# Patient Record
Sex: Male | Born: 1945 | Race: White | Hispanic: No | Smoking: Never smoker
Health system: Southern US, Community
[De-identification: ages and names within clinical notes are randomized; demographics above are authoritative.]

## PROBLEM LIST (undated history)

## (undated) DIAGNOSIS — I2510 Atherosclerotic heart disease of native coronary artery without angina pectoris: Secondary | ICD-10-CM

## (undated) DIAGNOSIS — C61 Malignant neoplasm of prostate: Secondary | ICD-10-CM

## (undated) DIAGNOSIS — F329 Major depressive disorder, single episode, unspecified: Secondary | ICD-10-CM

## (undated) DIAGNOSIS — I309 Acute pericarditis, unspecified: Secondary | ICD-10-CM

## (undated) DIAGNOSIS — I10 Essential (primary) hypertension: Secondary | ICD-10-CM

## (undated) DIAGNOSIS — E119 Type 2 diabetes mellitus without complications: Secondary | ICD-10-CM

## (undated) DIAGNOSIS — G2581 Restless legs syndrome: Secondary | ICD-10-CM

## (undated) DIAGNOSIS — I82409 Acute embolism and thrombosis of unspecified deep veins of unspecified lower extremity: Secondary | ICD-10-CM

## (undated) DIAGNOSIS — E785 Hyperlipidemia, unspecified: Secondary | ICD-10-CM

## (undated) DIAGNOSIS — F0390 Unspecified dementia without behavioral disturbance: Secondary | ICD-10-CM

## (undated) DIAGNOSIS — L409 Psoriasis, unspecified: Secondary | ICD-10-CM

## (undated) DIAGNOSIS — F419 Anxiety disorder, unspecified: Secondary | ICD-10-CM

## (undated) DIAGNOSIS — R413 Other amnesia: Secondary | ICD-10-CM

## (undated) HISTORY — DX: Other amnesia: R41.3

## (undated) HISTORY — DX: Atherosclerotic heart disease of native coronary artery without angina pectoris: I25.10

## (undated) HISTORY — DX: Acute pericarditis, unspecified: I30.9

## (undated) HISTORY — DX: Hyperlipidemia, unspecified: E78.5

## (undated) HISTORY — DX: Restless legs syndrome: G25.81

## (undated) HISTORY — PX: KNEE SURGERY: SHX244

## (undated) HISTORY — PX: TONSILLECTOMY: SUR1361

## (undated) HISTORY — PX: HERNIA REPAIR: SHX51

## (undated) HISTORY — DX: Type 2 diabetes mellitus without complications: E11.9

## (undated) HISTORY — DX: Acute embolism and thrombosis of unspecified deep veins of unspecified lower extremity: I82.409

---

## 2001-06-02 ENCOUNTER — Ambulatory Visit (HOSPITAL_COMMUNITY): Admission: RE | Admit: 2001-06-02 | Discharge: 2001-06-02 | Payer: Self-pay | Admitting: Internal Medicine

## 2001-06-02 ENCOUNTER — Encounter: Payer: Self-pay | Admitting: Internal Medicine

## 2001-06-02 ENCOUNTER — Encounter (INDEPENDENT_AMBULATORY_CARE_PROVIDER_SITE_OTHER): Payer: Self-pay | Admitting: Specialist

## 2004-05-12 ENCOUNTER — Ambulatory Visit (HOSPITAL_COMMUNITY): Admission: RE | Admit: 2004-05-12 | Discharge: 2004-05-12 | Payer: Self-pay | Admitting: Orthopedic Surgery

## 2009-12-14 ENCOUNTER — Ambulatory Visit: Payer: Self-pay | Admitting: Diagnostic Radiology

## 2009-12-14 ENCOUNTER — Emergency Department (HOSPITAL_BASED_OUTPATIENT_CLINIC_OR_DEPARTMENT_OTHER): Admission: EM | Admit: 2009-12-14 | Discharge: 2009-12-14 | Payer: Self-pay | Admitting: Emergency Medicine

## 2009-12-22 ENCOUNTER — Ambulatory Visit: Payer: Self-pay | Admitting: Cardiology

## 2009-12-22 ENCOUNTER — Ambulatory Visit: Payer: Self-pay | Admitting: Pulmonary Disease

## 2009-12-22 ENCOUNTER — Telehealth (INDEPENDENT_AMBULATORY_CARE_PROVIDER_SITE_OTHER): Payer: Self-pay | Admitting: *Deleted

## 2009-12-22 DIAGNOSIS — L408 Other psoriasis: Secondary | ICD-10-CM

## 2009-12-22 DIAGNOSIS — R0602 Shortness of breath: Secondary | ICD-10-CM | POA: Insufficient documentation

## 2009-12-22 DIAGNOSIS — R930 Abnormal findings on diagnostic imaging of skull and head, not elsewhere classified: Secondary | ICD-10-CM | POA: Insufficient documentation

## 2009-12-30 ENCOUNTER — Telehealth (INDEPENDENT_AMBULATORY_CARE_PROVIDER_SITE_OTHER): Payer: Self-pay | Admitting: *Deleted

## 2010-01-26 HISTORY — PX: NM MYOCAR PERF WALL MOTION: HXRAD629

## 2010-01-28 HISTORY — PX: US ECHOCARDIOGRAPHY: HXRAD669

## 2010-05-26 NOTE — Progress Notes (Signed)
Summary: talk to nurse  Phone Note Call from Patient Call back at [phone]   Caller: Daughter jody Call For: clance Summary of Call: daughter would like to talk to nurse about pt lung test and what dr clance plan to do next. Initial call taken by: Rickard Patience,  December 22, 2009 3:22 PM  Follow-up for Phone Call        Spoke with pt's daughter Augusto Gamble.  She is requesting results of CT chest- states that she was told by radiologist that there was not any evidence of pulmonary emboli, so now is wants to know what the next step is.  Pls advise thanks! Follow-up by: Vernie Murders,  December 22, 2009 3:27 PM  Additional Follow-up for Phone Call Additional follow up Details #1::        pt's daughter called again, daughter very concerned that if he starts with breathing distress tonight, needs doc on call to be able to say take him to ED and then be there to admit him.  Wants his pulmonary team to be there,  Going on over 2 weeks now. Additional Follow-up by: Eugene Gavia,  December 22, 2009 5:09 PM    Additional Follow-up for Phone Call Additional follow up Details #2::    please let her know that his ct showed no blood clots, and nothing else to explain his symptoms.  He has very little abnormality in the bottom of both lungs that may be due to resolving pna or lung that needs to pop back open after his episode of pna.  As we discussed, I think he needs to see a cardiologist for further evaluation.Marland KitchenMarland KitchenI will send a note to his primary md to make the referral.  If he has worsening symptoms, he should go to er for evaluation by ER md. Follow-up by: Barbaraann Share MD,  December 22, 2009 5:23 PM  Additional Follow-up for Phone Call Additional follow up Details #3:: Details for Additional Follow-up Action Taken: Spoke with pt and notified of results/recs per [facility].  Daughter verbalized understanding. Additional Follow-up by: Vernie Murders,  December 22, 2009 5:28 PM   Appended Document: talk to  nurse megan, please call this pt this am and make sure they call his primary md to get set up for a cardiac evaluation.  thanks  Appended Document: talk to nurse called and spoke with pt's daughter, Augusto Gamble, and reminded her to call pt's pcp today to get set up for a cardiac eval.  daughter states pt's symptoms haven't improved and she is going to take him to [facility] ER today to be evaluated.  Will forward message to [facility] as an FYI.   Appended Document: talk to nurse noted.

## 2010-05-26 NOTE — Progress Notes (Signed)
Summary: ? cards workup vs pulm  Phone Note Call from Patient   Caller: Daughter Call For: clance Summary of Call: pt was taken to [facility] er recently. was told that his SOB is due to pulm issues, not cardio related which daughter says is the opposite of what she understood dr clance to say. daughter Bridgette Habermann requests to speak to kc. pt continues to be SOB and has fever at night. jody # [phone] Initial call taken by: Tivis Ringer, CNA,  December 30, 2009 11:24 AM  Follow-up for Phone Call        Spoke with pt's daughter Augusto Gamble.  She states that pt was just seen at [facility] ER for fever, SOB, and weakness.  She states that they did a cxr and told her that pt has thickening in the lining of lungs that is chronic, and needed to see pulmonary doc.  She states that she told them that pt already had neg pulm workup and was told by [facility] needed to see cards.  They advised that this was wrong and nothing cardiac was going on.  She states that pt continues to have SOB, fever at night and weakness.  She states that he is no better since last seen.  Wants [facility] to review his ct scans again. Pls advise thanks Follow-up by: Vernie Murders,  December 30, 2009 2:05 PM  Additional Follow-up for Phone Call Additional follow up Details #1::        We have a ct scan from the end of august that does NOT show thickening of the lining of the lung.  A ct scan is 10 times better than a cxr at showing these things.  There is nothing on that ct scan which should cause neck pain, fever, or shortness of breath.  He does have slight thickening of the lining around the HEART, and has calcifications in the arteries that feed blood to the heart.  I am STRONGLY recommending that he see his primary care doctor ASAP for evaluation, and not keep going thru ER unless he is having an emergency Additional Follow-up by: Barbaraann Share MD,  December 30, 2009 4:45 PM    Additional Follow-up for Phone Call Additional follow up Details #2::    Spoke with pt's daughter Augusto Gamble and notified of the above recs per [facility].  Jody verbalized understanding and states that pt is to see his PCP tommorrow. Follow-up by: Vernie Murders,  December 30, 2009 4:55 PM

## 2010-05-26 NOTE — Assessment & Plan Note (Signed)
Summary: self referral for dyspnea, atypical chest pain   Copy to:  Self Referral Primary Yina Riviere/Referring Dereck Agerton:  Edgardo Roys, PA from [facility] in [facility]   History of Present Illness: The pt is a 65y/o male who comes in today as a self referral for evaluation of dyspnea, atypical chest pain, and ?recent pna.  Approximately 2 weeks ago, he noted the onset of atypical pain in his upper chest that radiated into his neck, and was associated with worsening sob.  This came on fairly suddenly, and was not pleuritic in nature.  He had no cough, congestion, or mucus production.  He did feel chills, and had a temp of 100 degrees.  He went to ER in [place], where a cxr showed poor inspiratory effort with small lung volumes, and basilar atx changes vs pna.  He was admitted to [facility], and given IV abx with oral abx at discharge of zpack and omnicef.  The pt felt some better once he got home, but did not return to his usual baseline.  He continues to feel "sick" and very weak.  Last night, he began to feel the "soreness" in his neck again, along with some intermittant discomfort in his chest and increasing sob.  He continues to deny chest congestion, cough, or mucus.  He is denying pleuritic chest pain.  He denies classic anginal chest pain, and has no history of cardiac disease.  Preventive Screening-Counseling & Management  Alcohol-Tobacco     Smoking Status: never  Current Medications (verified): 1)  Celexa 40 Mg Tabs (Citalopram Hydrobromide) .... Take 2 Tabs By Mouth At Bedtime 2)  Folcolin 40mg  .... Take 1 Tablet By Mouth Once A Day 3)  Xanax 1 Mg Tabs (Alprazolam) .... Take 1 Tablet By Mouth Three Times A Day 4)  Ritalin 10 Mg Tabs (Methylphenidate Hcl) .... As Needed 5)  Enbrel .... Take As Directed  Allergies (verified): No Known Drug Allergies  Past History:  Past Medical History:  PSORIASIS (ICD-696.1)    Past Surgical History: hernia surgery x 4 R knee surery tonsillectomy     Family History: Reviewed history and no changes required. none per pt  Social History: Reviewed history and no changes required. Patient never smoked.  pt is widowed. pt has children. pt lives alone. pt is retired.  prev worked for the [employer] in [employer] Status:  never  Review of Systems       The patient complains of shortness of breath with activity, shortness of breath at rest, chest pain, loss of appetite, sore throat, and fever.  The patient denies productive cough, non-productive cough, coughing up blood, irregular heartbeats, acid heartburn, indigestion, weight change, abdominal pain, difficulty swallowing, tooth/dental problems, headaches, nasal congestion/difficulty breathing through nose, sneezing, itching, ear ache, anxiety, depression, hand/feet swelling, joint stiffness or pain, rash, and change in color of mucus.    Vital Signs:  Patient profile:   65 year old male Height:      70.5 inches Weight:      203.25 pounds BMI:     28.86 O2 Sat:      96 % on Room air Temp:     98.4 degrees F oral Pulse rate:   102 / minute BP sitting:   140 / 70  (left arm) Cuff size:   regular  Vitals Entered By: Arman Filter LPN (December 22, 2009 11:28 AM)  O2 Flow:  Room air Comments Medications reviewed with patient Arman Filter LPN  December 22, 2009 11:32  AM    Physical Exam  General:  disheveled male in nad Eyes:  PERRLA and EOMI.   Nose:  patent without discharge Mouth:  clear Neck:  no jvd, tmg, LN no pain on palpation, no masses noted. Lungs:  mild basilar crackles, poor depth of inspiration no wheeziing or rhonchi Heart:  rrr, rate 96, no mrg Abdomen:  benign Extremities:  no cyanosis noted pt had boots difficult to remove....denies LE edema Neurologic:  alert and oriented, moves all 4.   Impression & Recommendations:  Problem # 1:  ABNORMAL CHEST XRAY (ICD-793.1) the pt has an abnormal cxr with basilar atx vs airspace disease, but has a very poor  inspiratory effort.  He is having various symptoms which are difficult for him to clarify, including this atypical upper chest and neck pain.  It really is unclear if he ever had pna, or whether this was simply atx related to poor depth of inpiration from splinting.  At this point, since he is still having symptoms despite abx, will check a ct chest.  This will help characterize his basilar abnl on cxr, will r/o PE, and will also evaluate for other vascular abnormalites of aorta, great vessels of chest.  If nothing is found of significance, I have recommended to him that he see his primary MD at the [facility] and arrange for cardiac evaluation.    Medications Added to Medication List This Visit: 1)  Celexa 40 Mg Tabs (Citalopram hydrobromide) .... Take 2 tabs by mouth at bedtime 2)  Folcolin 40mg   .... Take 1 tablet by mouth once a day 3)  Xanax 1 Mg Tabs (Alprazolam) .... Take 1 tablet by mouth three times a day 4)  Ritalin 10 Mg Tabs (Methylphenidate hcl) .... As needed 5)  Enbrel  .... Take as directed  Other Orders: New Patient Level V (91478) Radiology Referral (Radiology)  Patient Instructions: 1)  will check ct scan of your chest to evaluate for lung and vascular abnormalities. 2)  will call you with results when available 3)  if worsening symptoms that you feel need to be addressed in the interim, please go to the ER.

## 2010-07-02 ENCOUNTER — Observation Stay (HOSPITAL_COMMUNITY)
Admission: RE | Admit: 2010-07-02 | Discharge: 2010-07-02 | Disposition: A | Discharge status: Home / Self Care | Payer: Medicare Other | Source: Ambulatory Visit | Attending: Cardiology | Admitting: Cardiology

## 2010-07-02 DIAGNOSIS — R079 Chest pain, unspecified: Principal | ICD-10-CM | POA: Insufficient documentation

## 2010-07-02 DIAGNOSIS — I2510 Atherosclerotic heart disease of native coronary artery without angina pectoris: Secondary | ICD-10-CM | POA: Insufficient documentation

## 2010-07-02 DIAGNOSIS — R42 Dizziness and giddiness: Secondary | ICD-10-CM | POA: Insufficient documentation

## 2010-07-02 DIAGNOSIS — R0602 Shortness of breath: Secondary | ICD-10-CM | POA: Insufficient documentation

## 2010-07-02 HISTORY — PX: CARDIAC CATHETERIZATION: SHX172

## 2010-07-02 LAB — URINALYSIS, ROUTINE W REFLEX MICROSCOPIC
Bilirubin Urine: NEGATIVE
Ketones, ur: NEGATIVE mg/dL
Leukocytes, UA: NEGATIVE
Nitrite: NEGATIVE
Protein, ur: 100 mg/dL — AB

## 2010-07-02 LAB — APTT: aPTT: 26 seconds (ref 24–37)

## 2010-07-02 LAB — URINE MICROSCOPIC-ADD ON

## 2010-07-02 LAB — PROTIME-INR
INR: 1.03 (ref 0.00–1.49)
Prothrombin Time: 13.7 seconds (ref 11.6–15.2)

## 2010-07-08 NOTE — H&P (Signed)
NAME:  Gary Underwood, Gary Underwood NO.:  [id number]  MEDICAL RECORD NO.:  [id number]          PATIENT TYPE:  LOCATION:                                 FACILITY:  PHYSICIAN:  Thurmon Fair, MD     DATE OF BIRTH:  06-Nov-1945  DATE OF ADMISSION: DATE OF DISCHARGE:                             HISTORY & PHYSICAL   This dictation is in anticipation of cardiac catheterization to be performed tomorrow, March 8.  CHIEF COMPLAINT:  Chest pain.  REASON FOR CARDIAC CATHETERIZATION:  Possible unstable angina.  Gary Underwood is a 65 year old gentleman with known coronary artery calcifications by chest CT, but with a normal nuclear stress test (Persantine Myoview performed on November 26, 2009).  He presents after experiencing a 1-hour episode of severe anterior chest tightness radiating to his lower teeth last night.  The symptoms woke him from sleep.  The chest discomfort was severe and was associated with some moderate shortness of breath, but no diaphoresis, nausea, or vomiting. The symptoms gradually resolved spontaneously and he only mentioned them to his daughter this morning.  He believes that he might have had a heart attack.  The electrocardiogram performed in the office on arrival does not show any changes from his chronic EKG.  It shows sinus rhythm with a suggestion of a minor intraventricular conduction delay.  It has not significantly changed from previous tracings.  He is currently completely asymptomatic, although he feels a little "shaken up" emotionally.  He feels little dizzy and lightheaded, but has not had any syncope.  He has complained of dizziness before in the past.  He has a lot of complaints of arthralgias and muscle aches, which are chronic problems. Has not had any focal neurological deficits, intermittent claudication, lower extremity edema, fever, chills, cough, hemoptysis, pleurisy, or other recent changes on his review of systems.  PAST MEDICAL  HISTORY:  Significant for psoriasis for which he takes Enbrel infusions on a regular basis.  He is actually due for his next Enbrel infusion tomorrow.  About 6 months ago, he presented with marked shortness of breath and had a moderately large pericardial effusion.  Serology suggested that he might have had a case of acute Epstein-Barr virus myopericarditis.  His symptoms resolved after treatment nonsteroidal anti-inflammatory drugs. His Enbrel was briefly interrupted, but was restarted after the resolution of the pericarditis, which was demonstrated with serial echocardiograms.  During the workup, he underwent a CT of the chest that showed moderate LAD coronary artery calcifications, which was the reason he underwent his stress test.  He does have psoriatic arthritis.  PAST MEDICAL HISTORY:  Significantly negative for hypertension and diabetes mellitus, but he has a fairly severe hyperlipidemia and therefore we started on cholesterol-lowering therapy in October of last year.  A lipid profile was just performed earlier this week and now shows very favorable findings with a total cholesterol of 139, triglycerides of 85, HDL of 43, and LDL of 79.  On the same set of laboratory tests, he had an essentially normal routine chemistry panel with the exception of a borderline fasting glucose of 119.  The creatinine  was 1.1.  His CBC was likewise normal except for borderline polycythemia with a hemoglobin of 17.1, but the white blood cell count and platelet count were normal.  His TSH is normal.  His hemoglobin A1c was 6%.  A chest x-ray that was performed on March 1 was also normal. Previously described atelectasis in the lower lungs was no longer seen.  CURRENT MEDICATIONS: 1. Celexa 80 mg daily. 2. Focalin XR 30 mg daily. 3. 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 4. Xanax 1 mg t.i.d. 5. Enbrel injections weekly. 6. Lipitor 80 mg daily. 7. Abilify 10 mg daily. 8. Lisinopril 10 mg  daily. 9. Fish oil once daily.  He has known drug allergies.  PAST SURGICAL HISTORY:  Significant for knee surgery.  SOCIAL HISTORY:  He is a widower and is closest family is his daughter, Gary Underwood.  He does not drink, use recreational drugs, or smoke cigarettes. He usually walks on a daily basis.  PHYSICAL EXAMINATION:  GENERAL:  He looks worried, but is in no acute distress. VITAL SIGNS:  His blood pressure is 122/84, heart rate is 95, respiratory rate is 16, he weighs 216 pounds, which is actually an 18- pound increase since his last visit here in October.  He is 5 feet 10 inches tall. HEENT:  The head exam is unremarkable except for some patches of psoriasis around his scalp.  Pupils equal, round, and reactive to light. Extraocular movements intact.  Mouth, ears, nose, and oropharynx are within normal limits. NECK:  Supple.  Jugular venous pulsations are normal.  The are no carotid bruits. LUNGS:  Clear to auscultation bilaterally with symmetric respiratory excursions.  There is no evidence of abnormal fremitus, dullness to percussion, egophony, or other signs of consolidation. CARDIOVASCULAR:  Shows a  normal position and quality of apical impulse. Normal first and second heart sounds without any murmurs, rubs or gallops. ABDOMEN:  Soft, nontender, nondistended without any palpable mass, abnormal pulsatility, or flank bruits.  The bowel sounds are normal. EXTREMITIES:  Do not show any clubbing, cyanosis, or edema.  He has 2+ pulses in the radials, ulnars, brachials, femorals, popliteals, dorsalis pedis, and posterior tibials bilaterally.  He has some reduced range of motion in the right shoulder, presumably due to a rotator cuff tear.  His electrocardiogram and labs are described above as is his chest x- ray.  ASSESSMENT/PLAN:  Possible unstable angina.  Gary Underwood has known coronary atherosclerosis by CT scanning of the chest, but a fairly recent nuclear scan did not show  any evidence of coronary insufficiency. Nevertheless, his symptoms could be due to unstable angina.  He believed that the discomfort is chest is very different from any problems with reflux he may have had in the past.  It should also be noted that his current chest pain syndrome was in no way similar to his presentation when he had myopericarditis.  At that time, he had prominent shortness of breath and really no chest pain.  We discussed various options for further evaluation and I have recommended that he undergo coronary angiography to establish a definitive diagnosis.  He is scheduled to have this procedure done tomorrow.  He has been given 325 mg of aspirin, he is told to take a similar dose tomorrow morning.  If he should have any recurrence of chest pain lasting more than 15 minutes, he should immediately go to the emergency room.  The risks and benefits of diagnostic cardiac catheterization and possible percutaneous revascularization were discussed in  detail with Gary Underwood and his daughter. They understand and agree to proceed.     Thurmon Fair, MD     MC/MEDQ  D:  07/01/2010  T:  07/02/2010  Job:  [id number]  cc:   [facility] Georgianne Fick, M.D.  Electronically Signed by Thurmon Fair M.D. on 07/08/2010 12:40:11 PM

## 2010-07-08 NOTE — Procedures (Signed)
Gary Underwood, [name] NO.:  [id number]  MEDICAL RECORD NO.:  [id number]           PATIENT TYPE:  I  LOCATION:  2029                         FACILITY:  [facility]  PHYSICIAN:  Landry Corporal, MD DATE OF BIRTH:  1945/11/08  DATE OF PROCEDURE:  07/02/2010 DATE OF DISCHARGE:  07/02/2010                           CARDIAC CATHETERIZATION   PRIMARY CARDIOLOGIST:  Thurmon Fair, MD  PRIMARY CARE PHYSICIAN:  Georgianne Fick, MD  PROCEDURE PERFORMED: 1. Left heart catheterization via 5-French right femoral artery     access. 2. Left ventriculography from right RAO projection with 12 mL/second     of contrast for 25 seconds. 3. Selective native coronary angiography.  INDICATIONS:  Chest pain concerning for unstable angina.  BRIEF HISTORY:  Gary Underwood is a 65 year old gentleman with a history of hypertension, hyperlipidemia, who has had evaluation for chest pain in the past with negative Myoview.  However, several days ago, he had an episode of resting chest pain lasting roughly 1 hour.  He was then seen by Dr. Royann Shivers in the office, [facility].  After long discussion, decision was made to proceed with diagnostic cardiac catheterization for possible unstable angina.  The risks, benefits, alternatives, and indication of the procedure were explained to the patient in detail.  The patient voiced understanding and all questions answered.  The patient agreed to proceed and informed consent was obtained with the signed form placed on the chart.  PROCEDURE:  The patient was brought to the second floor [place] cardiac catheterization lab in the fasting state.  He was prepped and draped in usual sterile fashion with a right femoral access site prepped.  A time-out period was performed, and the patient was then sedated with intravenous Versed and fentanyl.  The right femoral head was then localized using tactile fluoroscopic  guidance, and the right groin was then anesthetized using 1% subcutaneous lidocaine.  The right common femoral artery was then accessed using the modified Seldinger technique with placement of a 5-French sheath.  Sheath was aspirated and flushed, and first a 5-French JL-4 followed by JR-4 catheter was advanced over wire.  Multiple angiographic views of first the right and left coronary systems were obtained.  The JR-4 catheter was then exchanged over the wire for a 5-French angled pigtail catheter, which was advanced across the aortic valve into the left ventricle.  Left ventricle hemodynamics were measured, and the left ventriculogram was performed in the RAO projection.  After completion of the left ventriculogram, the catheter was aspirated, flushed, and the left ventricle hemodynamics were remeasured.  Catheter was then pulled back across the aortic valve measuring the pullback gradient across the aortic valve.  CATHETERIZATION LABORATORY STATISTICS: 1. Sedation:  1 mg of Versed and 25 mcg of IV fentanyl. 2. Contrast:  80 mL of contrast.  COMPLICATIONS.:  None.  ESTIMATED BLOOD LOSS:  Less than 10 mL.  The patient was stable before, during, and after the procedure.  FINDINGS:  HEMODYNAMICS: 1. Aortic pressure 117/80 mmHg with a mean of 98 mmHg.     a.     Left  ventricular pressure 108/9 mmHg with an EDP of 40 mmHg. 2. Left ventriculogram showed ejection fraction 65% ,no wall motion     abnormalities.  ANGIOGRAPHIC FINDINGS: 1. The right coronary artery is a small nondominant artery with maybe     caliber dominant with partial posterior descending artery coming     from the a long large marginal branch.  The artery itself is a     small and mostly nondominant. 2. The left main is a large-caliber vessel, which bifurcates into the     LAD and circumflex. 3. The LAD is a moderate-to-large caliber vessel, which is diffusely     calcified from the left main down to about just  to the takeoff of     the first septal perforator.  Just following this, there is a     calcification that stops, and there is a step-down from a large-     caliber vessel and a moderate caliber vessel, but remainder of the     vessel itself remains the same caliber making this unlikely to be a     lesion, best 20-30% decreased in diameter.  There are two diagonal     branches with no significant disease.  Just after the first     diagonal branch, there is 20-30% lesion and then following the     second branch, has a short area, which was likely a myocardial     bridge.  The reminder of the LAD has no significant disease. 4. Circumflex artery is a large dominant vessel that gives rise to a     small first obtuse marginal and then a large for 4.5 mm second     obtuse marginal, which has up to 40% ostial lesion.  Remainder of     the circumflex close to the AV groove gives rise to posterior     descending artery and posterolateral branches.  No significant     disease is rest of this vessel.  IMPRESSION: 1. Mild-to-moderate nonobstructive coronary artery disease with     calcific left main proximal LAD and proximal circumflex consistent     with a CT scan. 2. Left dominant system.  No evidence of any coronary artery disease     to explain the chest pain. 3. Normal to hyperdynamic left ventricular function with no wall     motion abnormalities. 4. Small roughly 10-mm aortic valve pullback gradient.  PLAN:  The patient will be transferred to the holding area for sheath removal and adequate hemostasis, likely will be discharged tonight to follow Dr. Royann Shivers for continued medical management.          ______________________________ Landry Corporal, MD     DWH/MEDQ  D:  07/02/2010  T:  07/03/2010  Job:  [id number]  cc:   Georgianne Fick, M.D. Thurmon Fair, MD  Electronically Signed by Bryan Lemma MD on 07/08/2010 11:59:43 AM

## 2010-07-10 LAB — BASIC METABOLIC PANEL
BUN: 12 mg/dL (ref 6–23)
Chloride: 103 mEq/L (ref 96–112)
GFR calc non Af Amer: >60 mL/min (ref >60)
Glucose, Bld: 90 mg/dL (ref 70–99)
Potassium: 4 mEq/L (ref 3.5–5.1)
Sodium: 139 mEq/L (ref 135–145)

## 2010-07-10 LAB — POCT CARDIAC MARKERS
CKMB, poc: 1.5 ng/mL (ref 1.0–8.0)
Troponin i, poc: <0.05 ng/mL (ref 0.00–0.09)

## 2010-07-10 LAB — CBC
HCT: 47.6 % (ref 39.0–52.0)
Hemoglobin: 16.1 g/dL (ref 13.0–17.0)
MCV: 89.9 fL (ref 78.0–100.0)
RDW: 11.6 % (ref 11.5–15.5)
WBC: 13 10*3/uL — ABNORMAL HIGH (ref 4.0–10.5)

## 2010-07-10 LAB — DIFFERENTIAL
Basophils Absolute: 0.4 10*3/uL — ABNORMAL HIGH (ref 0.0–0.1)
Eosinophils Relative: 1 % (ref 0–5)
Lymphocytes Relative: 16 % (ref 12–46)
Monocytes Absolute: 2 10*3/uL — ABNORMAL HIGH (ref 0.1–1.0)
Monocytes Relative: 16 % — ABNORMAL HIGH (ref 3–12)
Neutro Abs: 8.4 10*3/uL — ABNORMAL HIGH (ref 1.7–7.7)

## 2010-07-12 ENCOUNTER — Emergency Department (HOSPITAL_COMMUNITY)
Admission: EM | Admit: 2010-07-12 | Discharge: 2010-07-12 | Disposition: A | Discharge status: Home / Self Care | Payer: Medicare Other | Attending: Emergency Medicine | Admitting: Emergency Medicine

## 2010-07-12 DIAGNOSIS — E785 Hyperlipidemia, unspecified: Secondary | ICD-10-CM | POA: Insufficient documentation

## 2010-07-12 DIAGNOSIS — M4800 Spinal stenosis, site unspecified: Secondary | ICD-10-CM | POA: Insufficient documentation

## 2010-07-12 DIAGNOSIS — R29898 Other symptoms and signs involving the musculoskeletal system: Secondary | ICD-10-CM | POA: Insufficient documentation

## 2010-07-12 DIAGNOSIS — I10 Essential (primary) hypertension: Secondary | ICD-10-CM | POA: Insufficient documentation

## 2010-07-12 LAB — DIFFERENTIAL
Basophils Absolute: 0.1 10*3/uL (ref 0.0–0.1)
Eosinophils Relative: 3 % (ref 0–5)
Lymphocytes Relative: 21 % (ref 12–46)
Lymphs Abs: 2.6 10*3/uL (ref 0.7–4.0)
Neutro Abs: 8.2 10*3/uL — ABNORMAL HIGH (ref 1.7–7.7)
Neutrophils Relative %: 66 % (ref 43–77)

## 2010-07-12 LAB — CBC
HCT: 48.1 % (ref 39.0–52.0)
Hemoglobin: 16.8 g/dL (ref 13.0–17.0)
MCV: 88.6 fL (ref 78.0–100.0)
RBC: 5.43 MIL/uL (ref 4.22–5.81)
RDW: 12.7 % (ref 11.5–15.5)
WBC: 12.5 10*3/uL — ABNORMAL HIGH (ref 4.0–10.5)

## 2010-07-12 LAB — BASIC METABOLIC PANEL
BUN: 13 mg/dL (ref 6–23)
Chloride: 100 mEq/L (ref 96–112)
GFR calc non Af Amer: >60 mL/min (ref >60)
Glucose, Bld: 96 mg/dL (ref 70–99)
Potassium: 4.1 mEq/L (ref 3.5–5.1)
Sodium: 136 mEq/L (ref 135–145)

## 2010-07-19 NOTE — Discharge Summary (Addendum)
Gary Underwood, Gary Underwood NO.:  [id number]  MEDICAL RECORD NO.:  [id number]           PATIENT TYPE:  E  LOCATION:  [facility]                         FACILITY:  [facility]  PHYSICIAN:  Landry Corporal, MD DATE OF BIRTH:  03/30/1946  DATE OF ADMISSION:  07/02/2010 DATE OF DISCHARGE:  07/02/2010                              DISCHARGE SUMMARY   ADMITTING CARDIOLOGIST:  Thurmon Fair, MD  PRIMARY PHYSICIAN:  Georgianne Fick, MD  ADMITTING DIAGNOSIS:  Chest pain concerning for unstable angina.  DISCHARGE DIAGNOSES:  Likely nonanginal chest pain with normal coronary arteries, but with nonobstructive coronary artery disease by cardiac catheterization.  BRIEF HISTORY:  Mr. Levario is a 65 year old gentleman with history of significant coronary artery calcification by chest CT with a normal Persantine Myoview on November 26, 2009, who also has a history of psoriasis taking Enbrel who noticed 1 hour episode of severe anterior chest tightness radiating to his lower teeth on the evening of the June 30, 2010.  He presented to Dr. Royann Shivers on 7th and the decision was made to proceed with diagnostic cardiac catheterization, plus/minus PCI.  He was then referred for outpatient cardiac catheterization  to be performed by myself.  BRIEF HOSPITAL COURSE:  The patient was admitted overnight and was brought to this second floor [facility] on the July 02, 2010.  He underwent diagnostic cardiac catheterization of which the full reports are dictated elsewhere.  I believe there was only 40% lesion in the large obtuse marginal branch with otherwise mild luminal irregularities in codominant left and right system.  EF was 65% with no wall motion abnormalities.  There was diffuse calcifications noted in the left main as well as proximal LAD and circumflex vessels.  The procedure was performed via the right femoral artery and the sheath was removed in the  holding area, and the patient was transferred to the postprocedure unit in a stable condition. He has no significant obstructive coronary disease noted, once the postprocedure monitoring was complete, the patient was discharged in stable condition to follow up Dr. Royann Shivers and Dr. Nicholos Johns.  ADMISSION MEDICATIONS: 1. Etanercept 50 mg/mL subcu 1 mL weekly. 2. Acidophilus 1 tablet daily. 3. Fish oil 1 dose daily. 4. Aspirin 81 mg daily. 5. Lisinopril 10 mg daily. 6. Alprazolam 0.5 mg b.i.d. as needed. 7. Citalopram 40 mg daily. 8. Atorvastatin 80 mg daily. 9. Abilify 10 mg at bedtime. 10.Focalin which is dexmethylphenidate 30 mg p.o. q.a.m.  DISCHARGE MEDICATIONS:  Some medications were continued.  PROCEDURE PERFORMED:  Left heart catheterization as described above.  DISCHARGE DISPOSITION:  The patient was stable at that time of discharge.  His groin was stable, and he had no further episodes of chest pain since the evening at 6.  He was discharged in stable condition to follow up with Dr. Royann Shivers and Dr. Nicholos Johns as he willbe scheduled by the [facility] staff.          ______________________________ Landry Corporal, MD     DWH/MEDQ  D:  07/13/2010  T:  07/14/2010  Job:  [id number]  cc:  Thurmon Fair, MD Georgianne Fick, M.D.  Electronically Signed by Bryan Lemma MD on 07/22/2010 11:19:35 PM

## 2012-04-09 ENCOUNTER — Emergency Department (HOSPITAL_BASED_OUTPATIENT_CLINIC_OR_DEPARTMENT_OTHER): Payer: Medicare Other

## 2012-04-09 ENCOUNTER — Emergency Department (HOSPITAL_BASED_OUTPATIENT_CLINIC_OR_DEPARTMENT_OTHER)
Admission: EM | Admit: 2012-04-09 | Discharge: 2012-04-09 | Disposition: A | Discharge status: Home / Self Care | Payer: Medicare Other | Attending: Emergency Medicine | Admitting: Emergency Medicine

## 2012-04-09 ENCOUNTER — Encounter (HOSPITAL_BASED_OUTPATIENT_CLINIC_OR_DEPARTMENT_OTHER): Payer: Self-pay | Admitting: *Deleted

## 2012-04-09 DIAGNOSIS — I10 Essential (primary) hypertension: Secondary | ICD-10-CM | POA: Insufficient documentation

## 2012-04-09 DIAGNOSIS — R21 Rash and other nonspecific skin eruption: Secondary | ICD-10-CM | POA: Insufficient documentation

## 2012-04-09 DIAGNOSIS — Z79899 Other long term (current) drug therapy: Secondary | ICD-10-CM | POA: Insufficient documentation

## 2012-04-09 DIAGNOSIS — L408 Other psoriasis: Secondary | ICD-10-CM | POA: Insufficient documentation

## 2012-04-09 DIAGNOSIS — L409 Psoriasis, unspecified: Secondary | ICD-10-CM

## 2012-04-09 DIAGNOSIS — I82409 Acute embolism and thrombosis of unspecified deep veins of unspecified lower extremity: Secondary | ICD-10-CM | POA: Insufficient documentation

## 2012-04-09 HISTORY — DX: Essential (primary) hypertension: I10

## 2012-04-09 HISTORY — DX: Psoriasis, unspecified: L40.9

## 2012-04-09 LAB — CBC WITH DIFFERENTIAL/PLATELET
Eosinophils Absolute: 0.9 10*3/uL — ABNORMAL HIGH (ref 0.0–0.7)
HCT: 45.1 % (ref 39.0–52.0)
Hemoglobin: 15.3 g/dL (ref 13.0–17.0)
Lymphs Abs: 2.3 10*3/uL (ref 0.7–4.0)
MCH: 27.9 pg (ref 26.0–34.0)
Monocytes Absolute: 1.5 10*3/uL — ABNORMAL HIGH (ref 0.1–1.0)
Monocytes Relative: 14 % — ABNORMAL HIGH (ref 3–12)
Neutro Abs: 5.7 10*3/uL (ref 1.7–7.7)
Neutrophils Relative %: 55 % (ref 43–77)
RBC: 5.49 MIL/uL (ref 4.22–5.81)

## 2012-04-09 LAB — COMPREHENSIVE METABOLIC PANEL
Alkaline Phosphatase: 100 U/L (ref 39–117)
BUN: 13 mg/dL (ref 6–23)
Chloride: 105 mEq/L (ref 96–112)
Creatinine, Ser: 1.2 mg/dL (ref 0.50–1.35)
GFR calc Af Amer: 71 mL/min — ABNORMAL LOW (ref >90)
Glucose, Bld: 98 mg/dL (ref 70–99)
Potassium: 4 mEq/L (ref 3.5–5.1)
Total Bilirubin: 0.5 mg/dL (ref 0.3–1.2)

## 2012-04-09 LAB — PRO B NATRIURETIC PEPTIDE: Pro B Natriuretic peptide (BNP): 39.3 pg/mL (ref 0–125)

## 2012-04-09 MED ORDER — ENOXAPARIN SODIUM 100 MG/ML ~~LOC~~ SOLN: 1.0000 mg/kg | Freq: Two times a day (BID) | SUBCUTANEOUS | Status: DC

## 2012-04-09 MED ORDER — WARFARIN SODIUM 5 MG PO TABS: 5.0000 mg | ORAL_TABLET | Freq: Every day | ORAL | Status: DC

## 2012-04-09 MED ORDER — CLINDAMYCIN HCL 150 MG PO CAPS: 150.0000 mg | ORAL_CAPSULE | Freq: Four times a day (QID) | ORAL | Status: DC

## 2012-04-09 MED ORDER — DIPHENHYDRAMINE HCL 50 MG/ML IJ SOLN
25.0000 mg | Freq: Once | INTRAMUSCULAR | Status: AC
  Administered 2012-04-09: 25 mg via INTRAVENOUS
  Filled 2012-04-09: qty 1

## 2012-04-09 MED ORDER — SODIUM CHLORIDE 0.9 % IV SOLN
Freq: Once | INTRAVENOUS | Status: AC
  Administered 2012-04-09: 14:00:00 via INTRAVENOUS

## 2012-04-09 MED ORDER — ENOXAPARIN SODIUM 100 MG/ML ~~LOC~~ SOLN
1.0000 mg/kg | Freq: Once | SUBCUTANEOUS | Status: AC
  Administered 2012-04-09: 95 mg via SUBCUTANEOUS
  Filled 2012-04-09: qty 1

## 2012-04-09 MED ORDER — WARFARIN SODIUM 5 MG PO TABS
5.0000 mg | ORAL_TABLET | Freq: Once | ORAL | Status: AC
  Administered 2012-04-09: 5 mg via ORAL
  Filled 2012-04-09: qty 1

## 2012-04-09 MED ORDER — CLINDAMYCIN PHOSPHATE 600 MG/50ML IV SOLN
600.0000 mg | Freq: Once | INTRAVENOUS | Status: AC
  Administered 2012-04-09: 600 mg via INTRAVENOUS
  Filled 2012-04-09: qty 50

## 2012-04-09 MED ORDER — IOHEXOL 350 MG/ML SOLN
100.0000 mL | Freq: Once | INTRAVENOUS | Status: AC | PRN
  Administered 2012-04-09: 100 mL via INTRAVENOUS

## 2012-04-09 NOTE — ED Provider Notes (Signed)
Patient turned over to me from Dr. Consuella Lose. Patient with known DVT based on Doppler studies done earlier today. CT angiogram rule out the ulnar embolism was negative. Discharge instructions and followup as an outpatient and treatment with Lovenox has already been arranged.  Results for orders placed during the hospital encounter of 04/09/12  CBC WITH DIFFERENTIAL      Component Value Range   WBC 10.4  4.0 - 10.5 K/uL   RBC 5.49  4.22 - 5.81 MIL/uL   Hemoglobin 15.3  13.0 - 17.0 g/dL   HCT 16.1  09.6 - 04.5 %   MCV 82.1  78.0 - 100.0 fL   MCH 27.9  26.0 - 34.0 pg   MCHC 33.9  30.0 - 36.0 g/dL   RDW 40.9  81.1 - 91.4 %   Platelets 198  150 - 400 K/uL   Neutrophils Relative 55  43 - 77 %   Neutro Abs 5.7  1.7 - 7.7 K/uL   Lymphocytes Relative 22  12 - 46 %   Lymphs Abs 2.3  0.7 - 4.0 K/uL   Monocytes Relative 14 (*) 3 - 12 %   Monocytes Absolute 1.5 (*) 0.1 - 1.0 K/uL   Eosinophils Relative 9 (*) 0 - 5 %   Eosinophils Absolute 0.9 (*) 0.0 - 0.7 K/uL   Basophils Relative 1  0 - 1 %   Basophils Absolute 0.1  0.0 - 0.1 K/uL  COMPREHENSIVE METABOLIC PANEL      Component Value Range   Sodium 141  135 - 145 mEq/L   Potassium 4.0  3.5 - 5.1 mEq/L   Chloride 105  96 - 112 mEq/L   CO2 27  19 - 32 mEq/L   Glucose, Bld 98  70 - 99 mg/dL   BUN 13  6 - 23 mg/dL   Creatinine, Ser 7.82  0.50 - 1.35 mg/dL   Calcium 9.2  8.4 - 95.6 mg/dL   Total Protein 7.0  6.0 - 8.3 g/dL   Albumin 3.7  3.5 - 5.2 g/dL   AST 43 (*) 0 - 37 U/L   ALT 72 (*) 0 - 53 U/L   Alkaline Phosphatase 100  39 - 117 U/L   Total Bilirubin 0.5  0.3 - 1.2 mg/dL   GFR calc non Af Amer 61 (*) >90 mL/min   GFR calc Af Amer 71 (*) >90 mL/min  PRO B NATRIURETIC PEPTIDE      Component Value Range   Pro B Natriuretic peptide (BNP) 39.3  0 - 125 pg/mL   Ct Angio Chest Pe W/cm &/or Wo Cm  04/09/2012  *RADIOLOGY REPORT*  Clinical Data: Shortness of breath.  Deep venous thrombosis.  CT ANGIOGRAPHY CHEST  Technique:  Multidetector  CT imaging of the chest using the standard protocol during bolus administration of intravenous contrast. Multiplanar reconstructed images including MIPs were obtained and reviewed to evaluate the vascular anatomy.  Contrast: OMNIPAQUE IOHEXOL 350 MG/ML SOLN  Comparison: CT angiogram dated 12/22/2009  Findings: There are no pulmonary emboli, infiltrates, effusions, mass lesions, or evidence of adenopathy.  Heart size is normal. Thoracic aorta is normal.  No acute osseous abnormality.  Again noted are multiple cysts in the left lobe of the liver, stable.  Small hiatal hernia.  IMPRESSION:  1.  No acute disease in the chest.  No pulmonary emboli. 2.  Moderate hiatal hernia.   Original Report Authenticated By: Francene Boyers, M.D.    US Venous Img Lower Bilateral  04/09/2012  *  RADIOLOGY REPORT*  Clinical Data: Lower leg swelling and redness.  BILATERAL LOWER EXTREMITY VENOUS DUPLEX ULTRASOUND  Technique:  Gray-scale sonography with graded compression, as well as color Doppler and duplex ultrasound, were performed to evaluate the deep venous system of both lower extremities from the level of the common femoral vein through the popliteal and proximal calf veins.  Spectral Doppler was utilized to evaluate flow at rest and with distal augmentation maneuvers.  Comparison:  None.  Findings:  Right lower extremity:  Normal compressibility, phasicity and augmentation within the common femoral vein, profunda vein, femoral vein popliteal vein.  Left lower extremity: There is decreased compressibility within the femoral vein on the left.  This extends from the proximal distal to distal femoral vein.  There is reduced flow within this vessel. The common femoral vein is normal with normal compressibility and flow.  IMPRESSION: 1.  Deep venous thrombosis within the femoral vein on the left which is partially occlusive versus recanalization of a more chronic clot. 2.  No evidence of deep venous thrombosis in the right lower  extremity.  Findings can made to Dr. Lajean Saver on 04/09/2012.   Original Report Authenticated By: Genevive Bi, M.D.       Shelda Jakes, MD 04/09/12 1754

## 2012-04-09 NOTE — ED Provider Notes (Signed)
History     CSN: [id number]  Arrival date & time 04/09/12  1240   First MD Initiated Contact with Patient 04/09/12 1316      Chief Complaint  Patient presents with  . Leg Swelling    (Consider location/radiation/quality/duration/timing/severity/associated sxs/prior treatment) HPI Comments: Patient complains of bilateral leg pain and swelling. He has a history of psoriasis and thinks its uncontrolled his current regimen of Humira Remicade. He denies any fevers, chills, nausea or vomiting. He is mainly here because his daughter is worried he has an infection in his leg. Denies any chest pain or shortness of breath. Denies any difficulty breathing or swallowing. He has followup with his dermatologist Dr. Yetta Barre later this week. His daughter may have an appointment with a second dermatologist for January.  The history is provided by the patient.    Past Medical History  Diagnosis Date  . Psoriasis   . Hypertension     History reviewed. No pertinent past surgical history.  History reviewed. No pertinent family history.  History  Substance Use Topics  . Smoking status: Never Smoker   . Smokeless tobacco: Not on file  . Alcohol Use: No      Review of Systems  Constitutional: Negative for fever.  HENT: Negative for congestion and rhinorrhea.   Respiratory: Negative for cough and shortness of breath.   Cardiovascular: Negative for chest pain.  Gastrointestinal: Negative for nausea, vomiting and abdominal pain.  Genitourinary: Negative for dysuria, hematuria and testicular pain.  Musculoskeletal: Negative for back pain.  Skin: Positive for rash and wound.  Neurological: Negative for weakness and headaches.    Allergies  Review of patient's allergies indicates no known allergies.  Home Medications   Current Outpatient Rx  Name  Route  Sig  Dispense  Refill  . HUMIRA [place]   Subcutaneous   Inject into the skin.         Marland Kitchen LISINOPRIL 10 MG PO TABS   Oral   Take 10 mg  by mouth daily.           BP 119/79  Pulse 92  Temp 98.1 F (36.7 C) (Oral)  Resp 20  Ht 5\' 10"  (1.778 m)  Wt 205 lb (92.987 kg)  BMI 29.41 kg/m2  SpO2 97%  Physical Exam  Constitutional: He is oriented to person, place, and time. He appears well-developed and well-nourished. No distress.  HENT:  Head: Normocephalic and atraumatic.  Right Ear: External ear normal.  Left Ear: External ear normal.  Mouth/Throat: Oropharynx is clear and moist. No oropharyngeal exudate.  Eyes: Conjunctivae normal and EOM are normal. Pupils are equal, round, and reactive to light.  Neck: Neck supple.  Cardiovascular: Normal rate, regular rhythm and normal heart sounds.   No murmur heard. Pulmonary/Chest: Effort normal and breath sounds normal. No respiratory distress.  Abdominal: Soft. There is no tenderness. There is no rebound and no guarding.  Musculoskeletal: Normal range of motion. He exhibits no edema and no tenderness.       +2 DP and PT pulses bilaterally. Calves soft and nontender. Full range of motion of toes, ankles, knees. Left distal tibia has a area of erythema with cracking excoriations that is minimally tender to palpation. There is some clear weeping. There is no lymphangitis or spreading redness.  Neurological: He is alert and oriented to person, place, and time. No cranial nerve deficit. He exhibits normal muscle tone. Coordination normal.  Skin: Skin is warm. Rash noted.  Diffuse psoriatic plaques    ED Course  Procedures (including critical care time)  Labs Reviewed  CBC WITH DIFFERENTIAL - Abnormal; Notable for the following:    Monocytes Relative 14 (*)     Monocytes Absolute 1.5 (*)     Eosinophils Relative 9 (*)     Eosinophils Absolute 0.9 (*)     All other components within normal limits  COMPREHENSIVE METABOLIC PANEL - Abnormal; Notable for the following:    AST 43 (*)     ALT 72 (*)     GFR calc non Af Amer 61 (*)     GFR calc Af Amer 71 (*)     All  other components within normal limits  PRO B NATRIURETIC PEPTIDE  URINALYSIS, ROUTINE W REFLEX MICROSCOPIC   No results found.   No diagnosis found.    MDM  One-month history of psoriasis with pain in his legs. No fever, chest pain, shortness of breath, nausea or vomiting. Daughter concerned he has an area of infection on the left lower leg.   Exam appears more consistent with psoriasis but cannot rule out area of early cellulitis to L ankle.  IV clindamycin, labs. Dopplers to r/o DVTs.  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   Cr 1.2, Platelets 198 Started coumadin/lovenox.  PO clindamycin for possible cellulitis.  F/u with dermatology this week as scheduled.  CT PE pending at time of sign out to Dr. Deretha Emory.    Glynn Octave, MD 04/09/12 [phone]

## 2012-04-09 NOTE — ED Notes (Signed)
Attempted to fax prescriptions to [facility], but fax failed, tried to call pharmacy, but hold longer than 10 minutes original prescriptions given to patients daughter

## 2012-04-09 NOTE — ED Notes (Signed)
Legs swelling x 1 month "Also psoriasis coming back" Denies other s/s.

## 2012-06-29 ENCOUNTER — Other Ambulatory Visit (HOSPITAL_COMMUNITY): Payer: Self-pay | Admitting: Cardiovascular Disease

## 2012-06-29 DIAGNOSIS — I82402 Acute embolism and thrombosis of unspecified deep veins of left lower extremity: Secondary | ICD-10-CM

## 2012-06-29 DIAGNOSIS — I739 Peripheral vascular disease, unspecified: Secondary | ICD-10-CM

## 2012-07-05 ENCOUNTER — Ambulatory Visit (HOSPITAL_COMMUNITY)
Admission: RE | Admit: 2012-07-05 | Discharge: 2012-07-05 | Disposition: A | Discharge status: Home / Self Care | Payer: Medicare Other | Source: Ambulatory Visit | Attending: Cardiovascular Disease | Admitting: Cardiovascular Disease

## 2012-07-05 DIAGNOSIS — I739 Peripheral vascular disease, unspecified: Secondary | ICD-10-CM | POA: Insufficient documentation

## 2012-07-05 DIAGNOSIS — I82409 Acute embolism and thrombosis of unspecified deep veins of unspecified lower extremity: Secondary | ICD-10-CM | POA: Insufficient documentation

## 2012-07-05 DIAGNOSIS — I82402 Acute embolism and thrombosis of unspecified deep veins of left lower extremity: Secondary | ICD-10-CM

## 2012-07-05 NOTE — Progress Notes (Signed)
Lower Extremity Venous Duplex Imaging - Limited Maxwell Caul

## 2012-12-06 ENCOUNTER — Telehealth: Payer: Self-pay | Admitting: Cardiovascular Disease

## 2012-12-06 DIAGNOSIS — I82402 Acute embolism and thrombosis of unspecified deep veins of left lower extremity: Secondary | ICD-10-CM

## 2012-12-06 NOTE — Telephone Encounter (Signed)
Would like to know when is it time for Gary Underwood to come back in for another doppler or to see the doctor.. Please Call

## 2012-12-06 NOTE — Telephone Encounter (Signed)
Returned call and spoke w/ Augusto Gamble, pt's daughter.  Stated she didn't know when pt is supposed to come back.  Reviewed last OV note and pt was to have doppler and f/u with Dr.Croitoru.  No return date on Doppler report.  Dr. Allyson Sabal notified and advised pt should repeat Doppler before seeing Dr. Royann Shivers.  Order placed and attempted to connect daughter with Scheduling, but unsuccessful.  Informed will have scheduler call her back.  Verbalized understanding.    Message given to schedulers to contact daughter for appt.

## 2012-12-14 ENCOUNTER — Encounter (HOSPITAL_COMMUNITY): Payer: Medicare Other

## 2012-12-18 ENCOUNTER — Encounter: Payer: Self-pay | Admitting: *Deleted

## 2012-12-19 ENCOUNTER — Ambulatory Visit (HOSPITAL_COMMUNITY)
Admission: RE | Admit: 2012-12-19 | Discharge: 2012-12-19 | Disposition: A | Discharge status: Home / Self Care | Payer: Medicare Other | Source: Ambulatory Visit | Attending: Cardiovascular Disease | Admitting: Cardiovascular Disease

## 2012-12-19 DIAGNOSIS — I82402 Acute embolism and thrombosis of unspecified deep veins of left lower extremity: Secondary | ICD-10-CM

## 2012-12-19 DIAGNOSIS — I82409 Acute embolism and thrombosis of unspecified deep veins of unspecified lower extremity: Secondary | ICD-10-CM

## 2012-12-19 NOTE — Progress Notes (Signed)
Left Lower Extremity Venous Duplex Completed. °Brianna L Mazza,RVT °

## 2012-12-21 ENCOUNTER — Encounter: Payer: Self-pay | Admitting: Cardiovascular Disease

## 2012-12-21 ENCOUNTER — Encounter: Payer: Self-pay | Admitting: *Deleted

## 2012-12-21 ENCOUNTER — Ambulatory Visit: Payer: Medicare Other | Admitting: Cardiovascular Disease

## 2013-01-31 ENCOUNTER — Emergency Department (HOSPITAL_BASED_OUTPATIENT_CLINIC_OR_DEPARTMENT_OTHER): Payer: Medicare Other

## 2013-01-31 ENCOUNTER — Encounter (HOSPITAL_BASED_OUTPATIENT_CLINIC_OR_DEPARTMENT_OTHER): Payer: Self-pay | Admitting: Emergency Medicine

## 2013-01-31 ENCOUNTER — Emergency Department (HOSPITAL_BASED_OUTPATIENT_CLINIC_OR_DEPARTMENT_OTHER)
Admission: EM | Admit: 2013-01-31 | Discharge: 2013-01-31 | Disposition: A | Discharge status: Home / Self Care | Payer: Medicare Other | Attending: Emergency Medicine | Admitting: Emergency Medicine

## 2013-01-31 DIAGNOSIS — F329 Major depressive disorder, single episode, unspecified: Secondary | ICD-10-CM | POA: Insufficient documentation

## 2013-01-31 DIAGNOSIS — S20219A Contusion of unspecified front wall of thorax, initial encounter: Secondary | ICD-10-CM | POA: Insufficient documentation

## 2013-01-31 DIAGNOSIS — S022XXA Fracture of nasal bones, initial encounter for closed fracture: Secondary | ICD-10-CM | POA: Insufficient documentation

## 2013-01-31 DIAGNOSIS — Y9289 Other specified places as the place of occurrence of the external cause: Secondary | ICD-10-CM | POA: Insufficient documentation

## 2013-01-31 DIAGNOSIS — S52599A Other fractures of lower end of unspecified radius, initial encounter for closed fracture: Secondary | ICD-10-CM | POA: Insufficient documentation

## 2013-01-31 DIAGNOSIS — D689 Coagulation defect, unspecified: Secondary | ICD-10-CM | POA: Insufficient documentation

## 2013-01-31 DIAGNOSIS — Z86718 Personal history of other venous thrombosis and embolism: Secondary | ICD-10-CM | POA: Insufficient documentation

## 2013-01-31 DIAGNOSIS — S20212A Contusion of left front wall of thorax, initial encounter: Secondary | ICD-10-CM

## 2013-01-31 DIAGNOSIS — Z7901 Long term (current) use of anticoagulants: Secondary | ICD-10-CM

## 2013-01-31 DIAGNOSIS — I2510 Atherosclerotic heart disease of native coronary artery without angina pectoris: Secondary | ICD-10-CM | POA: Insufficient documentation

## 2013-01-31 DIAGNOSIS — Z79899 Other long term (current) drug therapy: Secondary | ICD-10-CM | POA: Insufficient documentation

## 2013-01-31 DIAGNOSIS — Y93K1 Activity, walking an animal: Secondary | ICD-10-CM | POA: Insufficient documentation

## 2013-01-31 DIAGNOSIS — S0990XA Unspecified injury of head, initial encounter: Secondary | ICD-10-CM | POA: Insufficient documentation

## 2013-01-31 DIAGNOSIS — IMO0002 Reserved for concepts with insufficient information to code with codable children: Secondary | ICD-10-CM | POA: Insufficient documentation

## 2013-01-31 DIAGNOSIS — W1809XA Striking against other object with subsequent fall, initial encounter: Secondary | ICD-10-CM | POA: Insufficient documentation

## 2013-01-31 DIAGNOSIS — E785 Hyperlipidemia, unspecified: Secondary | ICD-10-CM | POA: Insufficient documentation

## 2013-01-31 DIAGNOSIS — S0081XA Abrasion of other part of head, initial encounter: Secondary | ICD-10-CM

## 2013-01-31 DIAGNOSIS — K0889 Other specified disorders of teeth and supporting structures: Secondary | ICD-10-CM

## 2013-01-31 DIAGNOSIS — W010XXA Fall on same level from slipping, tripping and stumbling without subsequent striking against object, initial encounter: Secondary | ICD-10-CM | POA: Insufficient documentation

## 2013-01-31 DIAGNOSIS — S025XXA Fracture of tooth (traumatic), initial encounter for closed fracture: Secondary | ICD-10-CM | POA: Insufficient documentation

## 2013-01-31 DIAGNOSIS — I10 Essential (primary) hypertension: Secondary | ICD-10-CM | POA: Insufficient documentation

## 2013-01-31 DIAGNOSIS — Z872 Personal history of diseases of the skin and subcutaneous tissue: Secondary | ICD-10-CM | POA: Insufficient documentation

## 2013-01-31 DIAGNOSIS — W19XXXA Unspecified fall, initial encounter: Secondary | ICD-10-CM

## 2013-01-31 DIAGNOSIS — S52502A Unspecified fracture of the lower end of left radius, initial encounter for closed fracture: Secondary | ICD-10-CM

## 2013-01-31 HISTORY — DX: Major depressive disorder, single episode, unspecified: F32.9

## 2013-01-31 LAB — CBC WITH DIFFERENTIAL/PLATELET
Basophils Relative: 0 % (ref 0–1)
Eosinophils Relative: 2 % (ref 0–5)
Hemoglobin: 16.3 g/dL (ref 13.0–17.0)
Lymphocytes Relative: 14 % (ref 12–46)
Lymphs Abs: 1.9 10*3/uL (ref 0.7–4.0)
MCH: 28 pg (ref 26.0–34.0)
Monocytes Relative: 11 % (ref 3–12)
Neutro Abs: 10.2 10*3/uL — ABNORMAL HIGH (ref 1.7–7.7)
Neutrophils Relative %: 74 % (ref 43–77)
RBC: 5.82 MIL/uL — ABNORMAL HIGH (ref 4.22–5.81)
RDW: 13.5 % (ref 11.5–15.5)
WBC: 13.9 10*3/uL — ABNORMAL HIGH (ref 4.0–10.5)

## 2013-01-31 LAB — BASIC METABOLIC PANEL
BUN: 16 mg/dL (ref 6–23)
CO2: 26 mEq/L (ref 19–32)
Chloride: 105 mEq/L (ref 96–112)
Creatinine, Ser: 1.3 mg/dL (ref 0.50–1.35)
GFR calc non Af Amer: 55 mL/min — ABNORMAL LOW (ref >90)
Glucose, Bld: 115 mg/dL — ABNORMAL HIGH (ref 70–99)
Potassium: 4.4 mEq/L (ref 3.5–5.1)

## 2013-01-31 LAB — PROTIME-INR: INR: 2.73 — ABNORMAL HIGH (ref 0.00–1.49)

## 2013-01-31 MED ORDER — HYDROCODONE-ACETAMINOPHEN 5-325 MG PO TABS: 2.0000 | ORAL_TABLET | ORAL | Status: DC | PRN

## 2013-01-31 MED ORDER — OXYCODONE-ACETAMINOPHEN 5-325 MG PO TABS: 2.0000 | ORAL_TABLET | ORAL | Status: DC | PRN

## 2013-01-31 NOTE — ED Notes (Signed)
Patient and family states the patient was walking his dog this morning and tripped on a curb and fell.  Multiple abrasions to face, lac to tongue , swelling and deformity of left wrist, and left rib pain.  Denies loc.

## 2013-01-31 NOTE — ED Notes (Signed)
PTS DAUGHTER Jody Moxley called requesting [place] to call [facility] and give an order to get a bedside commode. She states she will be staying with her father but is having difficulty getting him up and down to the bathroom spoke with Langston Masker pa who gives verbal order to fax the order for  A bedside commode to [facility]  Faxed order to [phone] daughter notified that this has been done

## 2013-01-31 NOTE — ED Provider Notes (Signed)
CSN: [id number]     Arrival date & time 01/31/13  7829 History   First MD Initiated Contact with Patient 01/31/13 0957     Chief Complaint  Patient presents with  . Fall   (Consider location/radiation/quality/duration/timing/severity/associated sxs/prior Treatment) HPI Comments: Patient is a 67 year old male past medical history significant for DVT and is on Coumadin. Present with complaints of facial injuries, chest pain, left wrist pain since a fall that occurred this morning. He was walking his dog when he fell and apparently hit his head on the curb. He denies that he passed out and denies any loss of consciousness. He reports this was a mechanical fall. He is complaining of pain in his teeth, right side of his face, laceration to the tongue.  Patient is a 67 y.o. male presenting with fall. The history is provided by the patient.  Fall This is a new problem. The current episode started 1 to 2 hours ago. The problem occurs constantly. The problem has not changed since onset.Pertinent negatives include no chest pain and no headaches. Nothing aggravates the symptoms. Nothing relieves the symptoms. He has tried nothing for the symptoms. The treatment provided no relief.    Past Medical History  Diagnosis Date  . Psoriasis   . Hypertension   . DVT (deep venous thrombosis)     LLE  . Acute myopericarditis     P/H 2011  . Coronary atherosclerosis     found on CT scan  . Hyperlipidemia   . Major depression    Past Surgical History  Procedure Laterality Date  . Hernia repair    . Tonsillectomy    . Knee surgery    . Cardiac catheterization  07/02/2010    mild to mod. nonobstructive coronary disease w/ca+ left main prox. LAD & prox. CX  . US echocardiography  01/28/2010    trivial PE  . Nm myocar perf wall motion  01/26/2010    normal   Family History  Problem Relation Age of Onset  . Aneurysm Mother   . Stroke Father   . Cancer Sister     breast   History  Substance Use Topics   . Smoking status: Never Smoker   . Smokeless tobacco: Not on file  . Alcohol Use: No    Review of Systems  Cardiovascular: Negative for chest pain.  Neurological: Negative for headaches.  All other systems reviewed and are negative.    Allergies  Review of patient's allergies indicates no known allergies.  Home Medications   Current Outpatient Rx  Name  Route  Sig  Dispense  Refill  . lamoTRIgine (LAMICTAL) 200 MG tablet   Oral   Take 200 mg by mouth daily.         Marland Kitchen Ustekinumab (STELARA) 90 MG/ML SOLN   Subcutaneous   Inject into the skin.         . Adalimumab (HUMIRA [place])   Subcutaneous   Inject into the skin.         Marland Kitchen buPROPion (WELLBUTRIN XL) 300 MG 24 hr tablet   Oral   Take 300 mg by mouth daily.         . clindamycin (CLEOCIN) 150 MG capsule   Oral   Take 1 capsule (150 mg total) by mouth every 6 (six) hours.   28 capsule   0   . Dexmethylphenidate HCl (FOCALIN XR) 40 MG CP24   Oral   Take 40 mg by mouth daily.         Marland Kitchen  enoxaparin (LOVENOX) 100 MG/ML injection   Subcutaneous   Inject 0.95 mLs (95 mg total) into the skin every 12 (twelve) hours.   10 Syringe   0   . escitalopram (LEXAPRO) 20 MG tablet   Oral   Take 20 mg by mouth 2 (two) times daily.         . hydrOXYzine (ATARAX/VISTARIL) 50 MG tablet   Oral   Take 50 mg by mouth 3 (three) times daily as needed for itching.         Marland Kitchen lisinopril (PRINIVIL,ZESTRIL) 10 MG tablet   Oral   Take 10 mg by mouth daily.         Marland Kitchen rOPINIRole (REQUIP) 1 MG tablet   Oral   Take 1 mg by mouth at bedtime.         . rosuvastatin (CRESTOR) 40 MG tablet   Oral   Take 40 mg by mouth daily.         . traZODone (DESYREL) 100 MG tablet   Oral   Take 200 mg by mouth at bedtime.         Marland Kitchen warfarin (COUMADIN) 5 MG tablet   Oral   Take 1 tablet (5 mg total) by mouth daily.   15 tablet   0    BP 126/72  Pulse 86  Temp(Src) 99 F (37.2 C) (Oral)  Resp 18  Ht 5\' 10"  (1.778 m)   Wt 218 lb (98.884 kg)  BMI 31.28 kg/m2  SpO2 96% Physical Exam  Nursing note and vitals reviewed. Constitutional: He is oriented to person, place, and time. He appears well-developed and well-nourished. No distress.  HENT:  Head: Normocephalic.  There are abrasions to the right cheekbone and above the right eye that are not actively bleeding. There is no bleeding from the nose and no septal hematoma. He reports pain in his front teeth however they seem to be firmly in place. There is a small laceration noted to the tongue which is not actively bleeding.  Eyes: EOM are normal. Pupils are equal, round, and reactive to light.  Neck: Normal range of motion. Neck supple.  Cardiovascular: Normal rate, regular rhythm and normal heart sounds.   No murmur heard. Pulmonary/Chest: Effort normal and breath sounds normal. No respiratory distress. He has no wheezes.  There is tenderness to palpation in the left lateral ribs. Breath sounds are equal  Abdominal: Soft. Bowel sounds are normal. He exhibits no distension. There is no tenderness.  Musculoskeletal: Normal range of motion.  The left wrist is noted to be swollen and slightly deformed. There is ecchymosis extending into the hand. Distal sensation, motor, and capillary refill are intact.  Lymphadenopathy:    He has no cervical adenopathy.  Neurological: He is alert and oriented to person, place, and time. No cranial nerve deficit. He exhibits normal muscle tone. Coordination normal.  Skin: Skin is warm and dry. He is not diaphoretic.    ED Course  Procedures (including critical care time) Labs Review Labs Reviewed  CBC WITH DIFFERENTIAL   Imaging Review No results found.  MDM  No diagnosis found. Patient was brought here after a fall. He was walking his dog struck his head on the curb. He injured his face, teeth, left ribs, and left wrist. As the patient is on Coumadin a CT scan of the head was obtained. This revealed no intracranial  hemorrhage but maxillofacial CT did show a nondisplaced nasal bone fracture. The imaging studies of the chest and  cervical spine were negative. The x-rays of the wrist did reveal an impacted left distal radius fracture. This will be splinted and the patient has an orthopedic doctor at [facility] he wishes to followup with. The daughter has also spoken with the patient's dentist to have his teeth evaluated. His front teeth are slightly loose but are firmly embedded in the sockets. He appears stable for discharge.    Geoffery Lyons, MD 01/31/13 1228

## 2013-07-25 ENCOUNTER — Encounter (INDEPENDENT_AMBULATORY_CARE_PROVIDER_SITE_OTHER): Payer: Self-pay

## 2013-07-25 ENCOUNTER — Ambulatory Visit (INDEPENDENT_AMBULATORY_CARE_PROVIDER_SITE_OTHER): Payer: Medicare Other | Admitting: Neurology

## 2013-07-25 ENCOUNTER — Encounter: Payer: Self-pay | Admitting: Neurology

## 2013-07-25 VITALS — BP 189/109 | HR 118 | Ht 68.0 in | Wt 228.0 lb

## 2013-07-25 DIAGNOSIS — R413 Other amnesia: Secondary | ICD-10-CM

## 2013-07-25 DIAGNOSIS — F0390 Unspecified dementia without behavioral disturbance: Secondary | ICD-10-CM | POA: Insufficient documentation

## 2013-07-25 HISTORY — DX: Other amnesia: R41.3

## 2013-07-25 NOTE — Progress Notes (Signed)
Reason for visit: Memory disorder  Gary Underwood is a 68 y.o. male  History of present illness:  Gary Underwood is a 68 year old right-handed white male with a history of a memory disturbance. The history is somewhat difficult to obtain, even from family members. The family members indicate that this gentleman had no problems with memory or cognitive processing prior to a fall that occurred 6 months ago. The patient struck his head, and fractured the left arm and he had facial trauma. The patient was somewhat confused and spacey after the fall for least 3 or 4 weeks, and then he seemed to gradually return to his baseline cognitive status. The patient has undergone a neurologic evaluation, and he underwent MRI of the brain that showed cortical atrophy, without significant small vessel disease. The patient has had blood work done looking for treatable causes of dementia, and the family indicates that these tests were unremarkable. The patient apparently was told he should not drive, and a form was sent to [facility] to have his license revoked. The patient was not having issues with safety with driving, or problems with directions while driving according to the family. The patient has not reported any focal numbness or weakness of the face, arms, or legs. The patient denies problems with balance, but he does have a history of prostate cancer and has some trouble with urinary incontinence. The patient indicates that he is sleeping well. The patient denies any problems with doing the finances, and a short-term memory problems or problems with long-term memory. The patient was placed on an Exelon patch, but he did not tolerate it. The patient comes to this office for an evaluation. Even though the family indicates that the memory issues occurred only after the fall 6 months ago, the patient was sent for a CT scan of the brain in September 2013 with a diagnosis of dementia at that time.  Past Medical History  Diagnosis  Date  . Psoriasis   . Hypertension   . DVT (deep venous thrombosis)     LLE  . Acute myopericarditis     P/H 2011  . Coronary atherosclerosis     found on CT scan  . Hyperlipidemia   . Major depression   . Memory deficit 07/25/2013  . Restless legs syndrome (RLS)     Past Surgical History  Procedure Laterality Date  . Hernia repair    . Tonsillectomy    . Knee surgery    . Cardiac catheterization  07/02/2010    mild to mod. nonobstructive coronary disease w/ca+ left main prox. LAD & prox. CX  . US echocardiography  01/28/2010    trivial PE  . Nm myocar perf wall motion  01/26/2010    normal    Family History  Problem Relation Age of Onset  . Aneurysm Mother   . Stroke Father   . Cancer Sister     breast  . Dementia Neg Hx     Social history:  reports that he has never smoked. He has never used smokeless tobacco. He reports that he does not drink alcohol or use illicit drugs.  Medications:  Current Outpatient Prescriptions on File Prior to Visit  Medication Sig Dispense Refill  . buPROPion (WELLBUTRIN XL) 300 MG 24 hr tablet Take 300 mg by mouth daily.      Marland Kitchen Dexmethylphenidate HCl (FOCALIN XR) 40 MG CP24 Take 40 mg by mouth daily.      Marland Kitchen escitalopram (LEXAPRO) 20 MG tablet Take  20 mg by mouth 2 (two) times daily.      Marland Kitchen lamoTRIgine (LAMICTAL) 200 MG tablet Take 200 mg by mouth daily.      Marland Kitchen lisinopril (PRINIVIL,ZESTRIL) 10 MG tablet Take 10 mg by mouth daily.      Marland Kitchen rOPINIRole (REQUIP) 1 MG tablet Take 1 mg by mouth at bedtime.      . rosuvastatin (CRESTOR) 40 MG tablet Take 40 mg by mouth daily.      . traZODone (DESYREL) 100 MG tablet Take 200 mg by mouth at bedtime.      Marland Kitchen Ustekinumab (STELARA) 90 MG/ML SOLN Inject into the skin.      Marland Kitchen warfarin (COUMADIN) 5 MG tablet Take 1 tablet (5 mg total) by mouth daily.  15 tablet  0   No current facility-administered medications on file prior to visit.     No Known Allergies  ROS:  Out of a complete 14 system review of  symptoms, the patient complains only of the following symptoms, and all other reviewed systems are negative.  Hearing loss Depression, anxiety Restless legs Urinary incontinence  Blood pressure 189/109, pulse 118, height 5\' 8"  (1.727 m), weight 228 lb (103.42 kg).  Physical Exam  General: The patient is alert and cooperative at the time of the examination.  Eyes: Pupils are equal, round, and reactive to light. Discs are flat bilaterally.  Neck: The neck is supple, no carotid bruits are noted.  Respiratory: The respiratory examination is clear.  Cardiovascular: The cardiovascular examination reveals a regular rate and rhythm, no obvious murmurs or rubs are noted.  Skin: Extremities are with 2+ edema at the ankles bilaterally.  Neurologic Exam  Mental status: The patient is alert and oriented x 3 at the time of the examination. The patient has apparent normal recent and remote memory, with an apparently normal attention span and concentration ability.The Mini-Mental status examination done today reveals a score of 29/30.  Cranial nerves: Facial symmetry is present. There is good sensation of the face to pinprick and soft touch bilaterally. The strength of the facial muscles and the muscles to head turning and shoulder shrug are normal bilaterally. Speech is well enunciated, no aphasia or dysarthria is noted. Extraocular movements are full. Visual fields are full. The tongue is midline, and the patient has symmetric elevation of the soft palate. No obvious hearing deficits are noted.  Motor: The motor testing reveals 5 over 5 strength of all 4 extremities. Good symmetric motor tone is noted throughout.  Sensory: Sensory testing is intact to pinprick, soft touch, vibration sensation, and position sense on all 4 extremities. No evidence of extinction is noted.  Coordination: Cerebellar testing reveals good finger-nose-finger and heel-to-shin bilaterally.The patient has some apraxia with  the use of the lower extremities. The patient is tremulous on all 4 extremities, with a fine intention tremor.  Gait and station: Gait is normal. Tandem gait is unsteady.  Romberg is negative. No drift is seen.  Reflexes: Deep tendon reflexes are symmetric and normal bilaterally. Toes are downgoing bilaterally.   Assessment/Plan:  One. Memory disturbance  2. Gait disturbance  3. History of concussion, post concussive syndrome  The patient appears to be somewhat tremulous and ataxic on examination. The patient scored fairly well on the Mini-Mental status examination, however. The patient will be sent for formal neuropsychological testing, as the patient has a history of depression as well. The patient will followup through this office in 6 months. The family is interested in allowing the  patient to drive if possible.  Jill Alexanders MD 07/25/2013 8:48 PM  [facility] [address] [place], [place] [address]  Phone [phone] Fax [phone]

## 2013-08-01 ENCOUNTER — Telehealth: Payer: Self-pay | Admitting: *Deleted

## 2013-08-01 ENCOUNTER — Encounter: Payer: Self-pay | Admitting: Cardiology

## 2013-08-01 ENCOUNTER — Ambulatory Visit (INDEPENDENT_AMBULATORY_CARE_PROVIDER_SITE_OTHER): Payer: Medicare Other | Admitting: Cardiology

## 2013-08-01 VITALS — BP 140/80 | HR 88 | Ht 70.0 in | Wt 229.0 lb

## 2013-08-01 DIAGNOSIS — Z7901 Long term (current) use of anticoagulants: Secondary | ICD-10-CM | POA: Insufficient documentation

## 2013-08-01 DIAGNOSIS — R350 Frequency of micturition: Secondary | ICD-10-CM

## 2013-08-01 DIAGNOSIS — I10 Essential (primary) hypertension: Secondary | ICD-10-CM | POA: Insufficient documentation

## 2013-08-01 DIAGNOSIS — R413 Other amnesia: Secondary | ICD-10-CM

## 2013-08-01 MED ORDER — LISINOPRIL 20 MG PO TABS: 20.0000 mg | ORAL_TABLET | Freq: Every day | ORAL | Status: DC

## 2013-08-01 NOTE — Assessment & Plan Note (Signed)
For DVT

## 2013-08-01 NOTE — Telephone Encounter (Signed)
Pt's daughter called in regards to her father's appointment. He is scheduled for May 12th and she feels that he needs to come in sooner. His BP has gone very high and he is taking BP medicine. His blood vessels is in his eye are breaking because of the coumadin went to eye doctor today. White and red blood count is elevated. Ankle swelling, has an unhealing wound in his mouth. The opthalmologist stated that he needed to come in sooner. Daughter stated that he is out of it.  [place]

## 2013-08-01 NOTE — Telephone Encounter (Signed)
Worked in today to see Cecilie Kicks, NP since Dr. Gwenlyn Found will be in the office and may need to step in to see the patient. Voiced understanding and will bring him in at 3:40pm.

## 2013-08-01 NOTE — Progress Notes (Signed)
08/01/2013   PCP: [name], MD   Chief Complaint  Patient presents with  . Follow-up    BP running high for past 2 weeks  [dizzyness]    Primary Cardiologist: Dr. Sallyanne Kuster   HPI:  68 year old male patient of Dr. Sallyanne Kuster presents today for elevated blood pressure. 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 At that time he had a pericardial effusion.  He has no history of significant coronary artery disease by stress test he had coronary atherosclerosis discovered incidentally on a CT scan though he does require aggressive treatment of coronary risk factors especially hyperlipidemia. He is currently on Crestor followed by his primary care physician. Other history includes psori and history of DVT with unknown cause therefore Dr. Gwenlyn Found felt he should be on anticoagulation lifelong.    Recently he has had several issues, memory issues, hypertension, and a ruptured blood vessel on this side. He has seen primary care which is [place] medical history also seen neurology who is doing studies on him.  His drivers license has been revoked at this time.  The patient states nothing is wrong with him he feels well. The daughter and son-in-law feel that he is confused at times or more just out of it. He also noted elevated blood pressure and in neurology he was elevated at 180/109.  Family also relates he's had some chest pain that's usually when he is at the computer no other time the patient feels like it's disease on the computer and his position. Patient denies chest pain or shortness of breath.  Blood pressure here today is stable.     No Known Allergies  Current Outpatient Prescriptions  Medication Sig Dispense Refill  . ALPRAZolam (XANAX) 1 MG tablet Take 1 mg by mouth 2 (two) times daily.      Marland Kitchen buPROPion (WELLBUTRIN XL) 300 MG 24 hr tablet Take 300 mg by mouth daily.      Marland Kitchen Dexmethylphenidate HCl (FOCALIN  XR) 40 MG CP24 Take 40 mg by mouth daily.      Marland Kitchen escitalopram (LEXAPRO) 20 MG tablet Take 20 mg by mouth 2 (two) times daily.      . finasteride (PROSCAR) 5 MG tablet Take 5 mg by mouth daily.      . hydrOXYzine (VISTARIL) 50 MG capsule Take 100 mg by mouth at bedtime.      . lamoTRIgine (LAMICTAL) 200 MG tablet Take 200 mg by mouth daily.      Marland Kitchen rOPINIRole (REQUIP) 1 MG tablet Take 1 mg by mouth at bedtime.      . rosuvastatin (CRESTOR) 40 MG tablet Take 40 mg by mouth daily.      . tamsulosin (FLOMAX) 0.4 MG CAPS capsule Take 0.4 mg by mouth daily.      . traZODone (DESYREL) 100 MG tablet Take 200 mg by mouth at bedtime.      Marland Kitchen Ustekinumab (STELARA) 90 MG/ML SOLN Inject into the skin.      . Vitamin D, Ergocalciferol, (DRISDOL) 50000 UNITS CAPS capsule Take 1 capsule by mouth.      . warfarin (COUMADIN) 5 MG tablet Take 1 tablet (5 mg total) by mouth daily.  15 tablet  0  . lisinopril (PRINIVIL,ZESTRIL) 20 MG tablet Take 1 tablet (20 mg total) by mouth daily.  90 tablet  3   No current facility-administered medications for this visit.  Past Medical History  Diagnosis Date  . Psoriasis   . Hypertension   . DVT (deep venous thrombosis)     LLE  . Acute myopericarditis     P/H 2011  . Coronary atherosclerosis     found on CT scan  . Hyperlipidemia   . Major depression   . Memory deficit 07/25/2013  . Restless legs syndrome (RLS)     Past Surgical History  Procedure Laterality Date  . Hernia repair    . Tonsillectomy    . Knee surgery    . Cardiac catheterization  07/02/2010    mild to mod. nonobstructive coronary disease w/ca+ left main prox. LAD & prox. CX  . US echocardiography  01/28/2010    trivial PE  . Nm myocar perf wall motion  01/26/2010    normal    EPP:IRJJOAC:ZY colds or fevers, + weight increase Skin:no rashes or ulcers HEENT:no blurred vision, no congestion CV:see HPI PUL:see HPI GI:no diarrhea constipation or melena, no indigestion GU:no hematuria, no  dysuria, freq urination MS:no joint pain, no claudication Neuro:no syncope, no lightheadedness Endo:no diabetes, no thyroid disease Heme:  WBC elevated at PCP no rea O. son.  PHYSICAL EXAM BP 140/80  Pulse 88  Ht 5\' 10"  (1.778 m)  Wt 229 lb (103.874 kg)  BMI 32.86 kg/m2 General:Pleasant affect, NAD Skin:Warm and dry, brisk capillary refill HEENT:normocephalic, sclera clear, mucus membranes moist Neck:supple, no JVD, no bruits  Heart:S1S2 RRR with soft 1/6 systolic murmur, no gallup, rub or click Lungs:clear without rales, rhonchi, or wheezes SAY:TKZS, non tender, + BS, do not palpate liver spleen or masses Ext:+1 lower ext edema, 2+ pedal pulses, 2+ radial pulses Neuro:alert and oriented, MAE, follows commands, + facial symmetry  EKG: Sinus rhythm rate 88 No acute changes from previous tracings.   ASSESSMENT AND PLAN HTN (hypertension) Blood pressure has been elevated and he does have lower extremity edema. But with frequent and sometimes uncontrolled urination I was hesitant to add diuretic at this time. Will increase lisinopril to 20 mg daily and his blood pressure is still elevated would add low dose diuretic. He has an appointment to follow with Dr. Sallyanne Kuster in May, he will keep that appt.  Frequent urination With elevated white count recently and frequent urination most likely due to his prostate cancer we'll check a urinalysis just to ensure there is no infection.  Memory deficit Neurology is evaluating.  Chronic anticoagulation, on coumadin for DVT For DVT

## 2013-08-01 NOTE — Assessment & Plan Note (Signed)
With elevated white count recently and frequent urination most likely due to his prostate cancer we'll check a urinalysis just to ensure there is no infection.

## 2013-08-01 NOTE — Patient Instructions (Signed)
I increased your lisinopril to 20 mg.  Will hold off of fluid med for now.  Check urinalysis.  If BP greater than 145/90 next week call and we will add diuretic then.  Keep appt with Dr. Sallyanne Kuster

## 2013-08-01 NOTE — Assessment & Plan Note (Signed)
Neurology is evaluating.

## 2013-08-01 NOTE — Assessment & Plan Note (Signed)
Blood pressure has been elevated and he does have lower extremity edema. But with frequent and sometimes uncontrolled urination I was hesitant to add diuretic at this time. Will increase lisinopril to 20 mg daily and his blood pressure is still elevated would add low dose diuretic. He has an appointment to follow with Dr. Sallyanne Kuster in May, he will keep that appt.

## 2013-08-02 ENCOUNTER — Ambulatory Visit: Payer: Medicare Other | Admitting: Cardiology

## 2013-08-10 ENCOUNTER — Telehealth: Payer: Self-pay | Admitting: Cardiovascular Disease

## 2013-08-16 NOTE — Telephone Encounter (Signed)
Closed encounter °

## 2013-08-21 ENCOUNTER — Encounter (HOSPITAL_BASED_OUTPATIENT_CLINIC_OR_DEPARTMENT_OTHER): Payer: Self-pay | Admitting: Emergency Medicine

## 2013-08-21 ENCOUNTER — Emergency Department (HOSPITAL_BASED_OUTPATIENT_CLINIC_OR_DEPARTMENT_OTHER)
Admission: EM | Admit: 2013-08-21 | Discharge: 2013-08-21 | Disposition: A | Discharge status: Home / Self Care | Payer: Medicare Other | Attending: Emergency Medicine | Admitting: Emergency Medicine

## 2013-08-21 ENCOUNTER — Telehealth: Payer: Self-pay | Admitting: Cardiovascular Disease

## 2013-08-21 ENCOUNTER — Emergency Department (HOSPITAL_BASED_OUTPATIENT_CLINIC_OR_DEPARTMENT_OTHER): Payer: Medicare Other

## 2013-08-21 DIAGNOSIS — M25473 Effusion, unspecified ankle: Secondary | ICD-10-CM | POA: Insufficient documentation

## 2013-08-21 DIAGNOSIS — F329 Major depressive disorder, single episode, unspecified: Secondary | ICD-10-CM | POA: Insufficient documentation

## 2013-08-21 DIAGNOSIS — R0602 Shortness of breath: Secondary | ICD-10-CM

## 2013-08-21 DIAGNOSIS — R739 Hyperglycemia, unspecified: Secondary | ICD-10-CM

## 2013-08-21 DIAGNOSIS — M79606 Pain in leg, unspecified: Secondary | ICD-10-CM

## 2013-08-21 DIAGNOSIS — G2581 Restless legs syndrome: Secondary | ICD-10-CM | POA: Insufficient documentation

## 2013-08-21 DIAGNOSIS — Z86718 Personal history of other venous thrombosis and embolism: Secondary | ICD-10-CM | POA: Insufficient documentation

## 2013-08-21 DIAGNOSIS — E785 Hyperlipidemia, unspecified: Secondary | ICD-10-CM | POA: Insufficient documentation

## 2013-08-21 DIAGNOSIS — R7309 Other abnormal glucose: Secondary | ICD-10-CM | POA: Insufficient documentation

## 2013-08-21 DIAGNOSIS — I2510 Atherosclerotic heart disease of native coronary artery without angina pectoris: Secondary | ICD-10-CM | POA: Insufficient documentation

## 2013-08-21 DIAGNOSIS — Z872 Personal history of diseases of the skin and subcutaneous tissue: Secondary | ICD-10-CM | POA: Insufficient documentation

## 2013-08-21 DIAGNOSIS — I10 Essential (primary) hypertension: Secondary | ICD-10-CM | POA: Insufficient documentation

## 2013-08-21 DIAGNOSIS — R791 Abnormal coagulation profile: Secondary | ICD-10-CM | POA: Insufficient documentation

## 2013-08-21 DIAGNOSIS — M79609 Pain in unspecified limb: Secondary | ICD-10-CM | POA: Insufficient documentation

## 2013-08-21 DIAGNOSIS — Z9889 Other specified postprocedural states: Secondary | ICD-10-CM | POA: Insufficient documentation

## 2013-08-21 DIAGNOSIS — Z7901 Long term (current) use of anticoagulants: Secondary | ICD-10-CM | POA: Insufficient documentation

## 2013-08-21 DIAGNOSIS — Z79899 Other long term (current) drug therapy: Secondary | ICD-10-CM | POA: Insufficient documentation

## 2013-08-21 DIAGNOSIS — M25476 Effusion, unspecified foot: Secondary | ICD-10-CM | POA: Insufficient documentation

## 2013-08-21 LAB — CBC WITH DIFFERENTIAL/PLATELET
BASOS ABS: 0 10*3/uL (ref 0.0–0.1)
BASOS PCT: 0 % (ref 0–1)
BLASTS: 0 %
Band Neutrophils: 20 % — ABNORMAL HIGH (ref 0–10)
Eosinophils Absolute: 0 10*3/uL (ref 0.0–0.7)
Eosinophils Relative: 0 % (ref 0–5)
HCT: 46.1 % (ref 39.0–52.0)
HEMOGLOBIN: 15.8 g/dL (ref 13.0–17.0)
Lymphocytes Relative: 13 % (ref 12–46)
Lymphs Abs: 2.2 10*3/uL (ref 0.7–4.0)
MCH: 28.1 pg (ref 26.0–34.0)
MCHC: 34.3 g/dL (ref 30.0–36.0)
MCV: 82 fL (ref 78.0–100.0)
MYELOCYTES: 4 %
Metamyelocytes Relative: 0 %
Monocytes Absolute: 0.3 10*3/uL (ref 0.1–1.0)
Monocytes Relative: 2 % — ABNORMAL LOW (ref 3–12)
NEUTROS ABS: 14.8 10*3/uL — AB (ref 1.7–7.7)
NEUTROS PCT: 61 % (ref 43–77)
PLATELETS: 143 10*3/uL — AB (ref 150–400)
Promyelocytes Absolute: 0 %
RBC: 5.62 MIL/uL (ref 4.22–5.81)
RDW: 14.3 % (ref 11.5–15.5)
WBC Morphology: INCREASED
WBC: 17.3 10*3/uL — ABNORMAL HIGH (ref 4.0–10.5)
nRBC: 0 /100 WBC

## 2013-08-21 LAB — PROTIME-INR
INR: 3.32 — ABNORMAL HIGH (ref 0.00–1.49)
PROTHROMBIN TIME: 32.5 s — AB (ref 11.6–15.2)

## 2013-08-21 LAB — BASIC METABOLIC PANEL
BUN: 23 mg/dL (ref 6–23)
CO2: 26 mEq/L (ref 19–32)
Calcium: 9.3 mg/dL (ref 8.4–10.5)
Chloride: 101 mEq/L (ref 96–112)
Creatinine, Ser: 1 mg/dL (ref 0.50–1.35)
GFR, EST AFRICAN AMERICAN: 88 mL/min — AB (ref >90)
GFR, EST NON AFRICAN AMERICAN: 76 mL/min — AB (ref >90)
Glucose, Bld: 264 mg/dL — ABNORMAL HIGH (ref 70–99)
POTASSIUM: 4.3 meq/L (ref 3.7–5.3)
Sodium: 141 mEq/L (ref 137–147)

## 2013-08-21 LAB — URINALYSIS, ROUTINE W REFLEX MICROSCOPIC
Bilirubin Urine: NEGATIVE
Hgb urine dipstick: NEGATIVE
Ketones, ur: NEGATIVE mg/dL
LEUKOCYTES UA: NEGATIVE
Nitrite: NEGATIVE
PROTEIN: NEGATIVE mg/dL
Specific Gravity, Urine: >1.046 — ABNORMAL HIGH (ref 1.005–1.030)
UROBILINOGEN UA: 1 mg/dL (ref 0.0–1.0)
pH: 7 (ref 5.0–8.0)

## 2013-08-21 LAB — URINE MICROSCOPIC-ADD ON

## 2013-08-21 LAB — TROPONIN I: Troponin I: <0.3 ng/mL (ref <0.30)

## 2013-08-21 MED ORDER — IOHEXOL 350 MG/ML SOLN
100.0000 mL | Freq: Once | INTRAVENOUS | Status: AC | PRN
  Administered 2013-08-21: 100 mL via INTRAVENOUS

## 2013-08-21 NOTE — Telephone Encounter (Signed)
Per son in law - pt is Sob  02 95 sitting; drops to 82 when walks 20 feet. Can't wait for appt on Friday.Marland Kitchen

## 2013-08-21 NOTE — Discharge Instructions (Signed)
High Blood Sugar High blood sugar (hyperglycemia) means that the level of sugar in your blood is higher than it should be. Signs of high blood sugar include:  Feeling thirsty.  Frequent peeing (urinating).  Feeling tired or sleepy.  Dry mouth.  Vision changes.  Feeling weak.  Feeling hungry but losing weight.  Numbness and tingling in your hands or feet.  Headache. When you ignore these signs, your blood sugar may keep going up. These problems may get worse, and other problems may begin. HOME CARE  Check your blood sugars as told by your doctor. Write down the numbers with the date and time.  Take the right amount of insulin or diabetes pills at the right time. Write down the dose with date and time.  Refill your insulin or diabetes pills before running out.  Watch what you eat. Follow your meal plan.  Drink liquids without sugar, such as water. Check with your doctor if you have kidney or heart disease.  Follow your doctor's orders for exercise. Exercise at the same time of day.  Keep your doctor's appointments. GET HELP RIGHT AWAY IF:   You have trouble thinking or are confused.  You have fast breathing with fruity smelling breath.  You pass out (faint).  You have 2 to 3 days of high blood sugars and you do not know why.  You have chest pain.  You are feeling sick to your stomach (nauseous) or throwing up (vomiting).  You have sudden vision changes. MAKE SURE YOU:   Understand these instructions.  Will watch your condition.  Will get help right away if you are not doing well or get worse. Document Released: 02/07/2009 Document Revised: 07/05/2011 Document Reviewed: 02/07/2009 [employer] Patient Information 2014 [employer].  

## 2013-08-21 NOTE — ED Notes (Signed)
Patient transported to CT 

## 2013-08-21 NOTE — ED Notes (Signed)
Family members say he has a blood clot in his lower left leg and are concerned that he is short of breath and stays tired. Family member states " I want to be sure the clot hasnt moved up into his lungs or something" pt also has cardiology appt on friday

## 2013-08-21 NOTE — ED Notes (Signed)
Pt here with his family who is concerned that the "blood clot" in his left lower leg is getting worse or moving since he has been having pain in the left lower leg as well as having more fatigue and shortness of breath over the last 2 to 3 weeks . Pt denies pain but does tell NP during assessment that his left lower leg may be a bit more more swollen than usual. Pt also has a cardiology appointment on friday

## 2013-08-21 NOTE — ED Notes (Signed)
Pt is unable to void. 

## 2013-08-21 NOTE — ED Notes (Signed)
Pt had no difficulty ambulating.

## 2013-08-21 NOTE — ED Notes (Signed)
Pt returned to ED room

## 2013-08-21 NOTE — ED Notes (Signed)
Consent received from daughter to request medical records.

## 2013-08-21 NOTE — ED Notes (Signed)
Patient denies wheelchair to room.

## 2013-08-21 NOTE — Telephone Encounter (Signed)
Doug, pt's son-in-law, called and verified pt x 2.  Stated pt's breathing has gotten shallow over the past week.  Stated pt walked about 20 ft and O2 sat dropped to 82 and pulse up to 118.  Stated pt has a blood clot in his leg and it has been there over a year.  Stated pt w/o CP and is real SOB.  Stated pt had an appt on Friday w/ Dr. Loletha Grayer, but can't wait that long.  Advised he take pt to [facility] ER now for evaluation.  Doug wanted to know if Dr. C worked at [facility] and informed he does.  Also  informed Dr. Loletha Grayer will be notified pt sent to ER.  Verbalized understanding and agreed w/ plan.

## 2013-08-21 NOTE — ED Notes (Signed)
Report received and care assumed.

## 2013-08-21 NOTE — Telephone Encounter (Signed)
Agree he should go to ED

## 2013-08-21 NOTE — ED Provider Notes (Signed)
CSN: [id number]     Arrival date & time 08/21/13  1308 History   First MD Initiated Contact with Patient 08/21/13 1311     Chief Complaint  Patient presents with  . Leg Pain  . Shortness of Breath     (Consider location/radiation/quality/duration/timing/severity/associated sxs/prior Treatment) HPI Comments: Pt states that he has been more sob and tired over the last couple of weeks. Pt states that he is not necessarily having more pain although he feels like his left leg has intermittent swelling but that is the extremity he has a clot in. Family states that his bp has also been more elevated even since his lisinopril was elevated 2 weeks ago. Pt state that his coumadin level was elevated recently so that dose was also altered. Denies cough, cp, weakness, numbness. Pt states that he doesn't think it is a big deal but his family dose and they brought him here today. Family states that they use a pulse ox recently and he oxygen dropped into the 80's when he was walking around  The history is provided by the patient. No language interpreter was used.    Past Medical History  Diagnosis Date  . Psoriasis   . Hypertension   . DVT (deep venous thrombosis)     LLE  . Acute myopericarditis     P/H 2011  . Coronary atherosclerosis     found on CT scan  . Hyperlipidemia   . Major depression   . Memory deficit 07/25/2013  . Restless legs syndrome (RLS)    Past Surgical History  Procedure Laterality Date  . Hernia repair    . Tonsillectomy    . Knee surgery    . Cardiac catheterization  07/02/2010    mild to mod. nonobstructive coronary disease w/ca+ left main prox. LAD & prox. CX  . US echocardiography  01/28/2010    trivial PE  . Nm myocar perf wall motion  01/26/2010    normal   Family History  Problem Relation Age of Onset  . Aneurysm Mother   . Stroke Father   . Cancer Sister     breast  . Dementia Neg Hx    History  Substance Use Topics  . Smoking status: Never Smoker   .  Smokeless tobacco: Never Used  . Alcohol Use: No    Review of Systems  All other systems reviewed and are negative.     Allergies  Review of patient's allergies indicates no known allergies.  Home Medications   Prior to Admission medications   Medication Sig Start Date End Date Taking? Authorizing Provider  ALPRAZolam Duanne Moron) 1 MG tablet Take 1 mg by mouth 2 (two) times daily. 05/11/13   Historical Provider, MD  buPROPion (WELLBUTRIN XL) 300 MG 24 hr tablet Take 300 mg by mouth daily.    Historical Provider, MD  Dexmethylphenidate HCl (FOCALIN XR) 40 MG CP24 Take 40 mg by mouth daily.    Historical Provider, MD  escitalopram (LEXAPRO) 20 MG tablet Take 20 mg by mouth 2 (two) times daily.    Historical Provider, MD  finasteride (PROSCAR) 5 MG tablet Take 5 mg by mouth daily. 05/28/13   Historical Provider, MD  hydrOXYzine (VISTARIL) 50 MG capsule Take 100 mg by mouth at bedtime. 05/28/13   Historical Provider, MD  lamoTRIgine (LAMICTAL) 200 MG tablet Take 200 mg by mouth daily.    Historical Provider, MD  lisinopril (PRINIVIL,ZESTRIL) 20 MG tablet Take 1 tablet (20 mg total) by mouth daily. 08/01/13  Cecilie Kicks, NP  rOPINIRole (REQUIP) 1 MG tablet Take 1 mg by mouth at bedtime.    Historical Provider, MD  rosuvastatin (CRESTOR) 40 MG tablet Take 40 mg by mouth daily.    Historical Provider, MD  tamsulosin (FLOMAX) 0.4 MG CAPS capsule Take 0.4 mg by mouth daily. 05/28/13   Historical Provider, MD  traZODone (DESYREL) 100 MG tablet Take 200 mg by mouth at bedtime.    Historical Provider, MD  Ustekinumab (STELARA) 90 MG/ML SOLN Inject into the skin.    Historical Provider, MD  Vitamin D, Ergocalciferol, (DRISDOL) 50000 UNITS CAPS capsule Take 1 capsule by mouth. 07/18/13   Historical Provider, MD  warfarin (COUMADIN) 5 MG tablet Take 1 tablet (5 mg total) by mouth daily. 04/09/12   Ezequiel Essex, MD   BP 164/87  Pulse 94  Resp 18  Ht 5\' 10"  (1.778 m)  Wt 220 lb (99.791 kg)  BMI 31.57  kg/m2  SpO2 97% Physical Exam  Nursing note and vitals reviewed. Constitutional: He is oriented to person, place, and time. He appears well-developed and well-nourished.  HENT:  Head: Normocephalic and atraumatic.  Cardiovascular: Normal rate and regular rhythm.   Pulmonary/Chest: Effort normal and breath sounds normal. No respiratory distress.  Abdominal: Soft. Bowel sounds are normal. There is no tenderness.  Neurological: He is alert and oriented to person, place, and time. He exhibits normal muscle tone. Coordination normal.  Skin:  Mild swelling noted at bilateral ankles. Pulses intact    ED Course  Procedures (including critical care time) Labs Review Labs Reviewed  CBC WITH DIFFERENTIAL - Abnormal; Notable for the following:    WBC 17.3 (*)    Platelets 143 (*)    Monocytes Relative 2 (*)    Band Neutrophils 20 (*)    Neutro Abs 14.8 (*)    All other components within normal limits  BASIC METABOLIC PANEL - Abnormal; Notable for the following:    Glucose, Bld 264 (*)    GFR calc non Af Amer 76 (*)    GFR calc Af Amer 88 (*)    All other components within normal limits  URINALYSIS, ROUTINE W REFLEX MICROSCOPIC - Abnormal; Notable for the following:    Specific Gravity, Urine >1.046 (*)    Glucose, UA >1000 (*)    All other components within normal limits  PROTIME-INR - Abnormal; Notable for the following:    Prothrombin Time 32.5 (*)    INR 3.32 (*)    All other components within normal limits  TROPONIN I  URINE MICROSCOPIC-ADD ON    Imaging Review Ct Angio Chest Pe W/cm &/or Wo Cm  08/21/2013   CLINICAL DATA:  Shortness of Breath ; prior deep venous thrombosis in lower extremity  EXAM: CT ANGIOGRAPHY CHEST WITH CONTRAST  TECHNIQUE: Multidetector CT imaging of the chest was performed using the standard protocol during bolus administration of intravenous contrast. Multiplanar CT image reconstructions and MIPs were obtained to evaluate the vascular anatomy.  CONTRAST:   156mL OMNIPAQUE IOHEXOL 350 MG/ML SOLN  COMPARISON:  Chest CT April 09, 2012 and chest radiograph January 31, 2013  FINDINGS: There is no demonstrable pulmonary embolus. There is no thoracic aortic aneurysm or dissection.  There is stable posterior pleural thickening bilaterally. There is patchy atelectasis in both lung bases. No edema or consolidation. There is underlying centrilobular emphysema with areas of mild bronchiectatic change in the upper and lower lobes bilaterally, slightly more pronounced in the lower than upper lobes.  Fairly small  hiatal hernia. Pericardium is not thickened. There is no appreciable thoracic adenopathy.  There are foci of coronary artery calcification.  In the visualized upper abdomen, there are multiple liver cysts, largest measuring 8.3 by 8.5 cm, essentially stable. Visualized upper abdominal structures otherwise appear unremarkable. There is degenerative change in the thoracic spine. There are no blastic or lytic bone lesions. There are hemangiomas in two mid thoracic vertebral bodies, stable. The thyroid appears unremarkable.  Review of the MIP images confirms the above findings.  IMPRESSION: No demonstrable pulmonary embolus. Small hiatal hernia. Underlying emphysematous change with mild bronchiectatic change. No appreciable adenopathy. Liver cysts remain stable.   Electronically Signed   By: Lowella Grip M.D.   On: 08/21/2013 15:15     EKG Interpretation None      MDM   Final diagnoses:  Hyperglycemia  INR (international normal ratio) abnormal  Leg pain  SOB (shortness of breath)    No pe or infection noted on exam. Noted the blood sugar of 264. Discussed finding with his DR. [name]. Obtained record from her office which showed normal bs and hgbA1c on 3/18. She recommended not started anything for blood sugar at this time and to have him to come and be evaluated in the next couple of days:pt walked and oxygen stays around 93%;nothing to be done  about that.family verbalizes"you are missing something"    Glendell Docker, NP 08/21/13 1641

## 2013-08-21 NOTE — ED Notes (Signed)
Non slip socks applied to patients feet. Pt ambulated here in the ED without difficulty with this RRT and Malon Kindle, RN.   Resting HR 90 and SPO2 94%.  After ambulating about 100 feet pt SpO2 was 92% and HR was 110.  Pt states "I feel fine. I can walk. Don't listen to them in my room."  No increase WOB noted nor signs of respiratory distress noted. NP notified.

## 2013-08-22 ENCOUNTER — Telehealth: Payer: Self-pay | Admitting: Cardiovascular Disease

## 2013-08-22 NOTE — Telephone Encounter (Signed)
She just wanted you to know he went to the [facility] ER in [place]. Pt could not breathe.Pt has appt with Dr C on Friday,08-24-13.

## 2013-08-22 NOTE — ED Provider Notes (Signed)
History/physical exam/procedure(s) were performed by non-physician practitioner and as supervising physician I was immediately available for consultation/collaboration. I have reviewed all notes and am in agreement with care and plan.   Shaune Pollack, MD 08/22/13 [phone]

## 2013-08-22 NOTE — Telephone Encounter (Signed)
Noted. Will forward to Dr. Loletha Grayer. As Gary Underwood

## 2013-08-24 ENCOUNTER — Ambulatory Visit (INDEPENDENT_AMBULATORY_CARE_PROVIDER_SITE_OTHER): Payer: Medicare Other | Admitting: Cardiovascular Disease

## 2013-08-24 ENCOUNTER — Encounter: Payer: Self-pay | Admitting: Cardiovascular Disease

## 2013-08-24 VITALS — BP 162/98 | HR 104 | Resp 16 | Ht 70.0 in | Wt 232.5 lb

## 2013-08-24 DIAGNOSIS — R7309 Other abnormal glucose: Secondary | ICD-10-CM

## 2013-08-24 DIAGNOSIS — R600 Localized edema: Secondary | ICD-10-CM

## 2013-08-24 DIAGNOSIS — Z79899 Other long term (current) drug therapy: Secondary | ICD-10-CM

## 2013-08-24 DIAGNOSIS — R0602 Shortness of breath: Secondary | ICD-10-CM

## 2013-08-24 DIAGNOSIS — E782 Mixed hyperlipidemia: Secondary | ICD-10-CM

## 2013-08-24 DIAGNOSIS — R0989 Other specified symptoms and signs involving the circulatory and respiratory systems: Secondary | ICD-10-CM

## 2013-08-24 DIAGNOSIS — R0609 Other forms of dyspnea: Secondary | ICD-10-CM

## 2013-08-24 DIAGNOSIS — I2510 Atherosclerotic heart disease of native coronary artery without angina pectoris: Secondary | ICD-10-CM

## 2013-08-24 DIAGNOSIS — R000 Tachycardia, unspecified: Secondary | ICD-10-CM

## 2013-08-24 DIAGNOSIS — R609 Edema, unspecified: Secondary | ICD-10-CM

## 2013-08-24 MED ORDER — LISINOPRIL 40 MG PO TABS: 40.0000 mg | ORAL_TABLET | Freq: Every day | ORAL | Status: DC

## 2013-08-24 MED ORDER — FUROSEMIDE 40 MG PO TABS: 40.0000 mg | ORAL_TABLET | Freq: Every day | ORAL | Status: DC

## 2013-08-24 NOTE — Progress Notes (Signed)
Patient ID: Gary Underwood, male   DOB: 1946/02/03, 68 y.o.   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 He has known coronary artery disease, but did not have severe stenoses by cardiac catheterization in March of 2012 (30% calcified proximal LAD, 40% ostial oblique marginal 1, no significant lesions in the right coronary artery). He has preserved left ventricle systolic function by left ventricular angiography and echo. In 2011 had a normal nuclear perfusion study (dipyridamole Myoview).  He has psoriasis and psoriatic arthritis. In 2011 while receiving treatment with Enbrel, he developed pericarditis with a large pericardial effusion. He also developed shortness of breath and an elevated white blood cell count Enbrel was discontinued and he was treated with nonsteroidal anti-inflammatory drugs. The pericardial effusion resolved. 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 He is now taking Stelara.  He has developed shortness of breath on exertion. As before the family is much more concerned and the patient. His daughter states that he have sent simply walking to the refrigerator. He also developed substantial lower extremity edema. He has occasional lightheadedness. His blood pressure has been quite high and remains high unit after his dose of lisinopril was increased last month. At home his blood pressure has frequently been around 158/97 mm Hg . He denies any type of chest discomfort.  The patient's major complaint is frequent urination, especially at night. This seems to be a combination of frequency and true polyuria. His serum glucose has been markedly elevated recently, well over 200 mg/deciliter. A  hemoglobin A1c was supposedly less than 6%, but it appears that this was a fairly remote past assay.  His electrocardiogram shows normal sinus rhythm and possible left atrial abnormality with very mild nonspecific T-wave flattening throughout.   No Known Allergies  Current Outpatient Prescriptions  Medication Sig Dispense Refill  . ALPRAZolam (XANAX) 1 MG tablet Take 1 mg by mouth 2 (two) times daily.      Marland Kitchen buPROPion (WELLBUTRIN XL) 300 MG 24 hr tablet Take 300 mg by mouth daily.      Marland Kitchen Dexmethylphenidate HCl (FOCALIN XR) 40 MG CP24 Take 40 mg by mouth daily.      Marland Kitchen escitalopram (LEXAPRO) 20 MG tablet Take 20 mg by mouth 2 (two) times daily.      . finasteride (PROSCAR) 5 MG tablet Take 5 mg by mouth daily.      . hydrOXYzine (VISTARIL) 50 MG capsule Take 100 mg by mouth at bedtime.      . lamoTRIgine (LAMICTAL) 200 MG tablet Take 200 mg by mouth daily.      Marland Kitchen rOPINIRole (REQUIP) 1 MG tablet Take 1 mg by mouth at bedtime.      . rosuvastatin (CRESTOR) 40 MG tablet Take 40 mg by mouth daily.      . tamsulosin (FLOMAX) 0.4 MG CAPS capsule Take 0.4 mg by mouth daily.      . traZODone (DESYREL) 100 MG tablet Take 200 mg by mouth at bedtime.      Marland Kitchen Ustekinumab (STELARA) 90 MG/ML SOLN Inject into the skin.      . Vitamin D, Ergocalciferol, (DRISDOL) 50000 UNITS CAPS capsule Take 1 capsule by mouth.      . warfarin (COUMADIN) 5 MG tablet Take  1 tablet (5 mg total) by mouth daily.  15 tablet  0  . furosemide (LASIX) 40 MG tablet Take 1 tablet (40 mg total) by mouth daily.  90 tablet  3  . lisinopril (PRINIVIL,ZESTRIL) 40 MG tablet Take 1 tablet (40 mg total) by mouth daily.  90 tablet  3   No current facility-administered medications for this visit.    Past Medical History  Diagnosis Date  . Psoriasis   . Hypertension   . DVT (deep venous thrombosis)     LLE  . Acute myopericarditis     P/H 2011  . Coronary atherosclerosis     found on CT scan  . Hyperlipidemia   . Major depression     . Memory deficit 07/25/2013  . Restless legs syndrome (RLS)     Past Surgical History  Procedure Laterality Date  . Hernia repair    . Tonsillectomy    . Knee surgery    . Cardiac catheterization  07/02/2010    mild to mod. nonobstructive coronary disease w/ca+ left main prox. LAD & prox. CX  . US echocardiography  01/28/2010    trivial PE  . Nm myocar perf wall motion  01/26/2010    normal    Family History  Problem Relation Age of Onset  . Aneurysm Mother   . Stroke Father   . Cancer Sister     breast  . Dementia Neg Hx     History   Social History  . Marital Status: Divorced    Spouse Name: N/A    Number of Children: 1  . Years of Education: college   Occupational History  . disabled    Social History Main Topics  . Smoking status: Never Smoker   . Smokeless tobacco: Never Used  . Alcohol Use: No  . Drug Use: No  . Sexual Activity: Not on file   Other Topics Concern  . Not on file   Social History Narrative  . No narrative on file    Review of systems: He has multiple musculoskeletal complaints, related to arthritis. The patient believes he is at most mildly short of breath, but the family's description suggests NYHA class III symptoms. He does not have dyspnea at rest, orthopnea, PND. He has developed persistent and marked lower extremity edema. He denies palpitations. He has not had syncope, but does have occasional orthostatic dizziness. His major complaint is polyuria and nocturia.  PHYSICAL EXAM BP 162/98  Pulse 104  Resp 16  Ht 5\' 10"  (1.778 m)  Wt 232 lb 8 oz (105.461 kg)  BMI 33.36 kg/m2  SpO2 95%  General: Alert, oriented x3, no distress. Psoriatic plaques. Head: no evidence of trauma, PERRL, EOMI, no exophtalmos or lid lag, no myxedema, no xanthelasma; normal ears, nose and oropharynx Neck: normal jugular venous pulsations and no hepatojugular reflux; brisk carotid pulses without delay and no carotid bruits Chest: clear to auscultation, no  signs of consolidation by percussion or palpation, normal fremitus, symmetrical and full respiratory excursions Cardiovascular: normal position and quality of the apical impulse, regular rhythm, normal first and second heart sounds, no murmurs, rubs or gallops Abdomen: no tenderness or distention, no masses by palpation, no abnormal pulsatility or arterial bruits, normal bowel sounds, no hepatosplenomegaly Extremities: no clubbing, cyanosis; bilateral pitting edema of the calves, 2+ on the right, 3+ on the left; 2+ radial, ulnar and brachial pulses bilaterally; 2+ right femoral, posterior tibial and dorsalis pedis pulses; 2+ left femoral, posterior tibial and dorsalis  pedis pulses; no subclavian or femoral bruits Neurological: grossly nonfocal   EKG: Sinus rhythm, left atrial abnormality, nonspecific T-wave flattening  Lipid Panel  No results found for this basename: chol, trig, hdl, cholhdl, vldl, ldlcalc    BMET    Component Value Date/Time   NA 141 08/21/2013 1342   K 4.3 08/21/2013 1342   CL 101 08/21/2013 1342   CO2 26 08/21/2013 1342   GLUCOSE 264* 08/21/2013 1342   BUN 23 08/21/2013 1342   CREATININE 1.00 08/21/2013 1342   CALCIUM 9.3 08/21/2013 1342   GFRNONAA 76* 08/21/2013 1342   GFRAA 88* 08/21/2013 1342     ASSESSMENT AND PLAN  Mr. Harnois has shortness of breath, marked edema and may have congestive heart failure. In the past he had preserved left ventricular systolic function. He does have known coronary disease but does not have angina pectoris. His ECG shows nonspecific changes. Have recommended that he have a repeat assay for coronary insufficiency. He cannot walk on the treadmill so will have to have a vasodilator study (Lexiscan Myoview). I think he requires diuretic therapy and have also increased his lisinopril dose. Recheck potassium and creatinine levels in roughly 2 weeks. We'll see him back in the clinic in about 3 weeks. I am sure that he will require additional increase  in antihypertensive medications. Carvedilol may be a good addition. If his nuclear study is abnormal he should undergo coronary angiography.  His polyuria and nocturia may be related to both untreated diabetes mellitus and congestive heart failure. We'll check another A1c level.  Orders Placed This Encounter  Procedures  . Comprehensive metabolic panel  . Hemoglobin A1c  . Lipid panel  . Myocardial Perfusion Imaging  . EKG 12-Lead   Meds ordered this encounter  Medications  . lisinopril (PRINIVIL,ZESTRIL) 40 MG tablet    Sig: Take 1 tablet (40 mg total) by mouth daily.    Dispense:  90 tablet    Refill:  3  . furosemide (LASIX) 40 MG tablet    Sig: Take 1 tablet (40 mg total) by mouth daily.    Dispense:  90 tablet    Refill:  3    Astella Desir  Sanda Klein, MD, [facility] [phone] office [phone] pager

## 2013-08-24 NOTE — Patient Instructions (Signed)
Increase Lisinopril to 40mg  daily (a new Rx has been sent to your pharmacy).  Start Furosemide 40mg  daily.    Your physician has requested that you have a lexiscan myoview. For further information please visit [email or web address]. Please follow instruction sheet, as given.  Your physician recommends that you return for lab work in:   In 10 days FASTING.  Your physician recommends that you schedule a follow-up appointment in:  3 weeks with an extender.  Dr. Sallyanne Kuster recommends that you schedule a follow-up appointment in:  After July 2015 with him.

## 2013-08-30 ENCOUNTER — Telehealth (HOSPITAL_COMMUNITY): Payer: Self-pay

## 2013-09-04 ENCOUNTER — Ambulatory Visit: Payer: Medicare Other | Admitting: Cardiovascular Disease

## 2013-09-04 ENCOUNTER — Ambulatory Visit (HOSPITAL_COMMUNITY)
Admission: RE | Admit: 2013-09-04 | Discharge: 2013-09-04 | Disposition: A | Discharge status: Home / Self Care | Payer: Medicare Other | Source: Ambulatory Visit | Attending: Cardiovascular Disease | Admitting: Cardiovascular Disease

## 2013-09-04 DIAGNOSIS — R000 Tachycardia, unspecified: Secondary | ICD-10-CM | POA: Insufficient documentation

## 2013-09-04 DIAGNOSIS — R0602 Shortness of breath: Secondary | ICD-10-CM | POA: Insufficient documentation

## 2013-09-04 MED ORDER — TECHNETIUM TC 99M SESTAMIBI GENERIC - CARDIOLITE
30.0000 | Freq: Once | INTRAVENOUS | Status: AC | PRN
  Administered 2013-09-04: 30 via INTRAVENOUS

## 2013-09-04 MED ORDER — TECHNETIUM TC 99M SESTAMIBI GENERIC - CARDIOLITE
10.0000 | Freq: Once | INTRAVENOUS | Status: AC | PRN
  Administered 2013-09-04: 10 via INTRAVENOUS

## 2013-09-04 MED ORDER — REGADENOSON 0.4 MG/5ML IV SOLN
0.4000 mg | Freq: Once | INTRAVENOUS | Status: AC
  Administered 2013-09-04: 0.4 mg via INTRAVENOUS

## 2013-09-04 NOTE — Procedures (Addendum)
[facility] [address] [place] [address] [phone]  Cardiology Nuclear Med Study  Gary Underwood is a 68 y.o. male     MRN : [id number]     DOB: [birth year]  Procedure Date: 09/04/2013  Nuclear Med Background Indication for Stress Test:  Evaluation for Ischemia History:  CAD;STENT/PTCA;myopericarditis-2011;Last NUC MPI on 01/27/2000-nonischemic;EF=63% Cardiac Risk Factors: Hypertension, Lipids, Obesity and DVT  Symptoms:  DOE, Fatigue, Light-Headedness, SOB and tachycardia   Nuclear Pre-Procedure Caffeine/Decaff Intake:  7:00pm NPO After: 7:00am   IV Site: R Forearm  IV 0.9% NS with Angio Cath:  22g  Chest Size (in):  48"  IV Started by: Rolene Course, RN  Height: 5\' 10"  (1.778 m)  Cup Size: n/a  BMI:  Body mass index is 33.29 kg/(m^2). Weight:  232 lb (105.235 kg)   Tech Comments:  n/a    Nuclear Med Study 1 or 2 day study: 1 day  Stress Test Type:  Liberal Provider:  Unk Lightning, MD   Resting Radionuclide: Technetium 100m Sestamibi  Resting Radionuclide Dose: 10.4 mCi   Stress Radionuclide:  Technetium 25m Sestamibi  Stress Radionuclide Dose: 29.9 mCi           Stress Protocol Rest HR:100 Stress HR: 106  Rest BP: 132/74 Stress BP: 132/74  Exercise Time (min): n/a METS: n/a          Dose of Adenosine (mg):  n/a Dose of Lexiscan: 0.4 mg  Dose of Atropine (mg): n/a Dose of Dobutamine: n/a mcg/kg/min (at max HR)  Stress Test Technologist: Mellody Memos, CCT Nuclear Technologist: Otho Perl, CNMT   Rest Procedure:  Myocardial perfusion imaging was performed at rest 45 minutes following the intravenous administration of Technetium 56m Sestamibi. Stress Procedure:  The patient received IV Lexiscan 0.4 mg over 15-seconds.  Technetium 43m Sestamibi injected IV at 30-seconds.  There were no significant changes with Lexiscan.  Quantitative spect images were obtained after a 45 minute  delay.  Transient Ischemic Dilatation (Normal <1.22):  1.34 Lung/Heart Ratio (Normal <0.45):  0.23 QGS EDV:  61 ml QGS ESV:  30 ml LV Ejection Fraction: 51%        Rest ECG: NSR - Normal EKG  Stress ECG: No significant change from baseline ECG  QPS Raw Data Images:  Normal; no motion artifact; normal heart/lung ratio. Stress Images:  Normal homogeneous uptake in all areas of the myocardium. Rest Images:  Normal homogeneous uptake in all areas of the myocardium. Subtraction (SDS):  No evidence of ischemia.  Impression Exercise Capacity:  Lexiscan with no exercise. BP Response:  Normal blood pressure response. Clinical Symptoms:  No significant symptoms noted. ECG Impression:  No significant ST segment change suggestive of ischemia. Comparison with Prior Nuclear Study: No significant change from previous study  Overall Impression:  Normal stress nuclear study.  LV Wall Motion:  NL LV Function; NL Wall Motion   Lorretta Harp, MD  09/04/2013 1:50 PM

## 2013-09-06 ENCOUNTER — Telehealth: Payer: Self-pay | Admitting: Cardiovascular Disease

## 2013-09-14 ENCOUNTER — Ambulatory Visit: Payer: Medicare Other | Admitting: Cardiology

## 2013-09-21 ENCOUNTER — Telehealth: Payer: Self-pay | Admitting: Cardiovascular Disease

## 2013-09-21 NOTE — Telephone Encounter (Signed)
Called pt - no answer; left message to call me back. 

## 2013-09-21 NOTE — Telephone Encounter (Signed)
Is calling about his blood pressure.Has a history of recent falls and light headiness. His bp was 132/70 when sitting and when standing it is 108/64. When the therapist took it 130/88 and then it dropped down to 118/86 and that was sitting and standing also. Advised the family to keep a log of bp when standing and sitting as well. Daughter is concern to why he is having these falls.. Please Call

## 2013-09-21 NOTE — Telephone Encounter (Signed)
New Prob    Has some questions regarding pt blood pressure. States BP has been low with exertion and ambulation. Please call,.

## 2013-09-21 NOTE — Telephone Encounter (Signed)
Pt. Daughter called back and informed per Dr. Debara Pickett to decrease the furosemide 20 mg and to make an appt to see either a PA or NP or Dr. Sallyanne Kuster

## 2013-09-21 NOTE — Telephone Encounter (Signed)
Pt. Has been having BP issues and has been dropping from 138 /72 in a sitting position to 84/50 when standing and gets very symptomatic ie dizziness and falling down , daughter states he's pretty well hydrated and has a pretty good appetite . I told daughter i would speak to Dr. Debara Pickett whom is the DOD today and see If he has any suggestion.

## 2013-09-28 ENCOUNTER — Ambulatory Visit (INDEPENDENT_AMBULATORY_CARE_PROVIDER_SITE_OTHER): Payer: Medicare Other | Admitting: Cardiovascular Disease

## 2013-09-28 ENCOUNTER — Ambulatory Visit (HOSPITAL_COMMUNITY)
Admission: RE | Admit: 2013-09-28 | Discharge: 2013-09-28 | Disposition: A | Discharge status: Home / Self Care | Payer: Medicare Other | Source: Ambulatory Visit | Attending: Cardiovascular Disease | Admitting: Cardiovascular Disease

## 2013-09-28 ENCOUNTER — Other Ambulatory Visit: Payer: Self-pay | Admitting: *Deleted

## 2013-09-28 VITALS — BP 100/64 | HR 94 | Ht 70.0 in | Wt 230.4 lb

## 2013-09-28 DIAGNOSIS — Z79899 Other long term (current) drug therapy: Secondary | ICD-10-CM

## 2013-09-28 DIAGNOSIS — E785 Hyperlipidemia, unspecified: Secondary | ICD-10-CM

## 2013-09-28 DIAGNOSIS — E1165 Type 2 diabetes mellitus with hyperglycemia: Secondary | ICD-10-CM

## 2013-09-28 DIAGNOSIS — Z86718 Personal history of other venous thrombosis and embolism: Secondary | ICD-10-CM

## 2013-09-28 DIAGNOSIS — R609 Edema, unspecified: Secondary | ICD-10-CM

## 2013-09-28 DIAGNOSIS — I2510 Atherosclerotic heart disease of native coronary artery without angina pectoris: Secondary | ICD-10-CM

## 2013-09-28 DIAGNOSIS — L819 Disorder of pigmentation, unspecified: Secondary | ICD-10-CM

## 2013-09-28 DIAGNOSIS — I825Y9 Chronic embolism and thrombosis of unspecified deep veins of unspecified proximal lower extremity: Secondary | ICD-10-CM

## 2013-09-28 DIAGNOSIS — I82512 Chronic embolism and thrombosis of left femoral vein: Secondary | ICD-10-CM

## 2013-09-28 DIAGNOSIS — F0390 Unspecified dementia without behavioral disturbance: Secondary | ICD-10-CM

## 2013-09-28 DIAGNOSIS — M7989 Other specified soft tissue disorders: Secondary | ICD-10-CM | POA: Insufficient documentation

## 2013-09-28 DIAGNOSIS — E119 Type 2 diabetes mellitus without complications: Secondary | ICD-10-CM

## 2013-09-28 MED ORDER — LISINOPRIL 10 MG PO TABS: 10.0000 mg | ORAL_TABLET | Freq: Every day | ORAL | Status: AC

## 2013-09-28 MED ORDER — FUROSEMIDE 40 MG PO TABS: 20.0000 mg | ORAL_TABLET | Freq: Every day | ORAL | Status: DC

## 2013-09-28 MED ORDER — CEPHALEXIN 500 MG PO CAPS: 500.0000 mg | ORAL_CAPSULE | Freq: Two times a day (BID) | ORAL | Status: DC

## 2013-09-28 NOTE — Progress Notes (Signed)
Left Lower Extremity Venous Duplex Completed. °Brianna L Mazza,RVT °

## 2013-09-28 NOTE — Patient Instructions (Signed)
CHANGE your Lisinopril to 10 mg  Take Lasix 40mg  TWICE daily for TWO DAYS only then change to 20mg  Twice daily START Keflex 500mg  Twice Daily for ten days   Your physician recommends that you return for lab work Next week   Your physician recommends that you schedule a follow-up appointment in: 2 weeks with Dr.Croitoru

## 2013-10-01 ENCOUNTER — Ambulatory Visit: Payer: Medicare Other | Admitting: Cardiology

## 2013-10-11 ENCOUNTER — Encounter: Payer: Self-pay | Admitting: Cardiology

## 2013-10-11 ENCOUNTER — Ambulatory Visit (INDEPENDENT_AMBULATORY_CARE_PROVIDER_SITE_OTHER): Payer: Medicare Other | Admitting: Cardiology

## 2013-10-11 VITALS — BP 152/92 | HR 95 | Ht 70.0 in | Wt 227.8 lb

## 2013-10-11 DIAGNOSIS — I10 Essential (primary) hypertension: Secondary | ICD-10-CM

## 2013-10-11 DIAGNOSIS — Z7901 Long term (current) use of anticoagulants: Secondary | ICD-10-CM

## 2013-10-11 DIAGNOSIS — R609 Edema, unspecified: Secondary | ICD-10-CM

## 2013-10-11 DIAGNOSIS — R600 Localized edema: Secondary | ICD-10-CM

## 2013-10-11 DIAGNOSIS — I2510 Atherosclerotic heart disease of native coronary artery without angina pectoris: Secondary | ICD-10-CM

## 2013-10-11 DIAGNOSIS — Z79899 Other long term (current) drug therapy: Secondary | ICD-10-CM

## 2013-10-11 MED ORDER — METOLAZONE 2.5 MG PO TABS: 2.5000 mg | ORAL_TABLET | ORAL | Status: DC

## 2013-10-11 NOTE — Patient Instructions (Addendum)
Kerin Ransom, PA-C has recommended making the following medication changes:  START Zaroxyln 2.5 mg - 1 tablet twice weekly 30 minutes before LASIX  Take 1 tablet tomorrow and 1 tablet on Saturday then start taking it every Tuesday and Friday.  Your physician has ordered you to have blood work to be done in 1 week.  Your INR today was 2.9, we will call with any NEW dosing instructions.  Lurena Joiner also recommends wearing compression stockings. They are to be worn during the day and taken off at night. These can be purchased at any medical supply store or pharmacy. If purchasing from a pharmacy, start with a mild strength.  Your physician recommends that you schedule a follow-up appointment in 1 month.

## 2013-10-11 NOTE — Assessment & Plan Note (Signed)
Negative Myoview 09/04/13, Nl LVF

## 2013-10-11 NOTE — Progress Notes (Signed)
10/11/2013 Gary Underwood   07-Jul-1945  [id number]  Primary Physicia Gary Axe, MD Primary Cardiologist: Dr Gary Underwood  HPI:  68 y/o male with multiple medical issues here with his family. He has chronic venous edema and a history of DVT. He is on chronic Coumadin. Cath in March 2012 showed minor CAD. [place] done 09/04/13 was negative and LVF was normal. His main issue today is LE edema.   Current Outpatient Prescriptions  Medication Sig Dispense Refill  . ALPRAZolam (XANAX) 1 MG tablet Take 1 mg by mouth 2 (two) times daily.      Marland Kitchen buPROPion (WELLBUTRIN XL) 300 MG 24 hr tablet Take 300 mg by mouth daily.      . cephALEXin (KEFLEX) 500 MG capsule Take 1 capsule (500 mg total) by mouth 2 (two) times daily.  20 capsule  0  . Dexmethylphenidate HCl (FOCALIN XR) 40 MG CP24 Take 40 mg by mouth daily.      Marland Kitchen escitalopram (LEXAPRO) 20 MG tablet Take 20 mg by mouth 2 (two) times daily.      . finasteride (PROSCAR) 5 MG tablet Take 5 mg by mouth daily.      . furosemide (LASIX) 40 MG tablet Take 20 mg by mouth 2 (two) times daily.      . hydrOXYzine (VISTARIL) 50 MG capsule Take 100 mg by mouth at bedtime.      . lamoTRIgine (LAMICTAL) 200 MG tablet Take 200 mg by mouth daily.      Marland Kitchen lisinopril (PRINIVIL,ZESTRIL) 10 MG tablet Take 1 tablet (10 mg total) by mouth daily.  90 tablet  3  . metFORMIN (GLUCOPHAGE-XR) 500 MG 24 hr tablet Take 1,000 mg by mouth daily.      Marland Kitchen rOPINIRole (REQUIP) 1 MG tablet Take 1 mg by mouth at bedtime.      . rosuvastatin (CRESTOR) 40 MG tablet Take 40 mg by mouth daily.      . tamsulosin (FLOMAX) 0.4 MG CAPS capsule Take 0.4 mg by mouth daily.      . traZODone (DESYREL) 100 MG tablet Take 200 mg by mouth at bedtime.      Marland Kitchen Ustekinumab (STELARA) 90 MG/ML SOLN Inject 90 mg into the skin every 3 (three) months.       . Vitamin D, Ergocalciferol, (DRISDOL) 50000 UNITS CAPS capsule Take 1 capsule by mouth.      . warfarin (COUMADIN) 5 MG tablet Take 1 tablet (5 mg total)  by mouth daily.  15 tablet  0   No current facility-administered medications for this visit.    No Known Allergies  History   Social History  . Marital Status: Divorced    Spouse Name: N/A    Number of Children: 1  . Years of Education: college   Occupational History  . disabled    Social History Main Topics  . Smoking status: Never Smoker   . Smokeless tobacco: Never Used  . Alcohol Use: No  . Drug Use: No  . Sexual Activity: Not on file   Other Topics Concern  . Not on file   Social History Narrative  . No narrative on file     Review of Systems: General: negative for chills, fever, night sweats or weight changes.  Cardiovascular: negative for chest pain, dyspnea on exertion, edema, orthopnea, palpitations, paroxysmal nocturnal dyspnea or shortness of breath Dermatological: negative for rash Respiratory: negative for cough or wheezing Urologic: negative for hematuria Abdominal: negative for nausea, vomiting, diarrhea, bright red blood  per rectum, melena, or hematemesis Neurologic: negative for visual changes, syncope, or dizziness All other systems reviewed and are otherwise negative except as noted above.    Blood pressure 152/92, pulse 95, height 5\' 10"  (1.778 m), weight 227 lb 12.8 oz (103.329 kg).  General appearance: alert, cooperative, no distress, moderately obese and chronically ill, disheveled Lungs: clear to auscultation bilaterally Heart: regular rate and rhythm Extremities: 2+ chronic edema   ASSESSMENT AND PLAN:   Bilateral leg edema Chronic venous edema  CAD (coronary artery disease) Negative Myoview 09/04/13, Nl LVF  Chronic anticoagulation, on coumadin for DVT .  HTN (hypertension) B/P 116/68 by me with regular cuff   PLAN  Compression stockings, Zaroxolyn 2.5 mg x 2 days, then twice a week. BMP next week. RTC one month.  Underwood,Gary KPA-C 10/11/2013 4:05 PM

## 2013-10-11 NOTE — Assessment & Plan Note (Signed)
Chronic venous edema 

## 2013-10-11 NOTE — Assessment & Plan Note (Signed)
B/P 116/68 by me with regular cuff

## 2013-10-21 ENCOUNTER — Encounter: Payer: Self-pay | Admitting: Cardiovascular Disease

## 2013-10-21 DIAGNOSIS — E785 Hyperlipidemia, unspecified: Secondary | ICD-10-CM | POA: Insufficient documentation

## 2013-10-21 DIAGNOSIS — E119 Type 2 diabetes mellitus without complications: Secondary | ICD-10-CM | POA: Insufficient documentation

## 2013-10-21 DIAGNOSIS — I82519 Chronic embolism and thrombosis of unspecified femoral vein: Secondary | ICD-10-CM | POA: Insufficient documentation

## 2013-10-21 NOTE — Progress Notes (Signed)
Patient ID: Gary Underwood, male   DOB: 08-13-1945, 68 y.o.   MRN: [id number]     HPI: Gary Underwood is a 68 y.o. male who presents to the office today for follow up cardiology evaluation.  He is a patient of Dr. Sallyanne Kuster who was seen by me today since he missed his appointment with Dr. Sallyanne Kuster last week.  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  In 2011 and normal myocardial perfusion study.  He also has a history of psoriasis with psoriatic arthritis and developed pericarditis with large pericardial effusion in 2011 while receiving treatment with improved.  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  He does note some mild shortness of breath with activity.  He also has had some blood pressure lability.  He saw Dr. Burt Knack on 08/24/2013.  He increase his lisinopril.  He scheduled him for a nuclear perfusion study, which was done on 09/04/2013 and remain normal without scar or ischemia.  He does note leg edema.  He also has noted shortness of breath with decreased oxygenation with activity.  He has a history of prostate CA.  He also had several falls over the past 3-6 months.  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  He presents for evaluation.  Past Medical History  Diagnosis Date  . Psoriasis   . Hypertension   . DVT (deep venous thrombosis)     LLE  . Acute myopericarditis     P/H 2011  . Coronary atherosclerosis     found on CT scan  . Hyperlipidemia   . Major depression   . Memory deficit 07/25/2013  . Restless legs syndrome (RLS)   . CAD (coronary artery disease) 08/24/2013    Past Surgical History  Procedure Laterality Date  . Hernia repair    . Tonsillectomy    . Knee surgery    . Cardiac  catheterization  07/02/2010    mild to mod. nonobstructive coronary disease w/ca+ left main prox. LAD & prox. CX  . US echocardiography  01/28/2010    trivial PE  . Nm myocar perf wall motion  01/26/2010    normal    No Known Allergies  Current Outpatient Prescriptions  Medication Sig Dispense Refill  . ALPRAZolam (XANAX) 1 MG tablet Take 1 mg by mouth 2 (two) times daily.      Marland Kitchen buPROPion (WELLBUTRIN XL) 300 MG 24 hr tablet Take 300 mg by mouth daily.      Marland Kitchen Dexmethylphenidate HCl (FOCALIN XR) 40 MG CP24 Take 40 mg by mouth daily.      Marland Kitchen escitalopram (LEXAPRO) 20 MG tablet Take 20 mg by mouth 2 (two) times daily.      . finasteride (PROSCAR) 5 MG tablet Take 5 mg by mouth daily.      . hydrOXYzine (VISTARIL) 50 MG capsule Take 100 mg by mouth at bedtime.      . lamoTRIgine (LAMICTAL) 200 MG tablet Take 200 mg by mouth daily.      . metFORMIN (GLUCOPHAGE-XR) 500 MG 24 hr tablet Take 1,000 mg by mouth daily.      Marland Kitchen rOPINIRole (REQUIP) 1 MG tablet Take 1 mg by mouth at bedtime.      . rosuvastatin (CRESTOR) 40 MG tablet Take 40  mg by mouth daily.      . tamsulosin (FLOMAX) 0.4 MG CAPS capsule Take 0.4 mg by mouth daily.      . traZODone (DESYREL) 100 MG tablet Take 200 mg by mouth at bedtime.      Marland Kitchen Ustekinumab (STELARA) 90 MG/ML SOLN Inject 90 mg into the skin every 3 (three) months.       . Vitamin D, Ergocalciferol, (DRISDOL) 50000 UNITS CAPS capsule Take 1 capsule by mouth.      . warfarin (COUMADIN) 5 MG tablet Take 1 tablet (5 mg total) by mouth daily.  15 tablet  0  . cephALEXin (KEFLEX) 500 MG capsule Take 1 capsule (500 mg total) by mouth 2 (two) times daily.  20 capsule  0  . furosemide (LASIX) 40 MG tablet Take 20 mg by mouth 2 (two) times daily.      Marland Kitchen lisinopril (PRINIVIL,ZESTRIL) 10 MG tablet Take 1 tablet (10 mg total) by mouth daily.  90 tablet  3  . metolazone (ZAROXOLYN) 2.5 MG tablet Take 1 tablet (2.5 mg total) by mouth 2 (two) times a week. Take Tuesdays and Fridays, 30  minutes before Lasix.  10 tablet  11   No current facility-administered medications for this visit.    History   Social History  . Marital Status: Divorced    Spouse Name: N/A    Number of Children: 1  . Years of Education: college   Occupational History  . disabled    Social History Main Topics  . Smoking status: Never Smoker   . Smokeless tobacco: Never Used  . Alcohol Use: No  . Drug Use: No  . Sexual Activity: Not on file   Other Topics Concern  . Not on file   Social History Narrative  . No narrative on file    Family History  Problem Relation Age of Onset  . Aneurysm Mother   . Stroke Father   . Cancer Sister     breast  . Dementia Neg Hx     ROS General: Negative; No fevers, chills, or night sweats; positive for frequent falls HEENT: Negative; No changes in vision or hearing, sinus congestion, difficulty swallowing Pulmonary: Negative; No cough, wheezing, shortness of breath, hemoptysis Cardiovascular: See HPI: No chest pain, presyncope, syncope, palpitations Positive for leg swelling. GI: Negative; No nausea, vomiting, diarrhea, or abdominal pain GU: History of prostate CA No dysuria, hematuria, or difficulty voiding Musculoskeletal: Negative; no myalgias, joint pain, or weakness Hematologic: Negative; no easy bruising, bleeding Endocrine: Positive for diabetes mellitus no heat/cold intolerance;  Neuro positive for frontal lobe dementia. Skin: Negative; No rashes or skin lesions Psychiatric: Negative; No behavioral problems, depression Sleep: Negative; No snoring,  daytime sleepiness, hypersomnolence, bruxism, restless legs, hypnogognic hallucinations. Other comprehensive 14 point system review is negative   Physical Exam BP 100/64  Pulse 94  Ht 5\' 10"  (1.778 m)  Wt 230 lb 6.4 oz (104.509 kg)  BMI 33.06 kg/m2 Repeat blood pressure by me was 117/72 supine with a pulse of 94, sitting, 102/70 with a pulse of 95, and standing 98/65 with a pulse of  95. General: Alert, oriented, no distress.  Skin: normal turgor, no rashes, warm and dry HEENT: Normocephalic, atraumatic. Pupils equal round and reactive to light; sclera anicteric; extraocular muscles intact, No lid lag; Nose without nasal septal hypertrophy; Mouth/Parynx benign; Mallinpatti scale 3 Neck: No JVD, no carotid bruits; normal carotid upstroke Lungs: clear to ausculatation and percussion bilaterally; no wheezing or rales, normal inspiratory  and expiratory effort Chest wall: without tenderness to palpitation Heart: PMI not displaced, RRR, s1 s2 normal, 1/6 systolic murmur, No diastolic murmur, no rubs, gallops, thrills, or heaves Abdomen: soft, nontender; no hepatosplenomehaly, BS+; abdominal aorta nontender and not dilated by palpation. Back: no CVA tenderness Pulses: 2+  Musculoskeletal: full range of motion, normal strength, no joint deformities Extremities:  positive for 2+ pitting edema.  Positive for mild venous changes with erythema, left greater than right.  no clubbing cyanosis orHoman's sign negative  Neurologic: grossly nonfocal; Cranial nerves grossly wnl Psychologic: Normal mood and affect   ECG (independently read by me): Sinus rhythm at 94 beats per minute.  Early transition.  Prolonged QT interval.  LABS:  BMET    Component Value Date/Time   NA 141 08/21/2013 1342   K 4.3 08/21/2013 1342   CL 101 08/21/2013 1342   CO2 26 08/21/2013 1342   GLUCOSE 264* 08/21/2013 1342   BUN 23 08/21/2013 1342   CREATININE 1.00 08/21/2013 1342   CALCIUM 9.3 08/21/2013 1342   GFRNONAA 76* 08/21/2013 1342   GFRAA 88* 08/21/2013 1342     Hepatic Function Panel     Component Value Date/Time   PROT 7.0 04/09/2012 1400   ALBUMIN 3.7 04/09/2012 1400   AST 43* 04/09/2012 1400   ALT 72* 04/09/2012 1400   ALKPHOS 100 04/09/2012 1400   BILITOT 0.5 04/09/2012 1400     CBC    Component Value Date/Time   WBC 17.3* 08/21/2013 1342   RBC 5.62 08/21/2013 1342   HGB 15.8 08/21/2013  1342   HCT 46.1 08/21/2013 1342   PLT 143* 08/21/2013 1342   MCV 82.0 08/21/2013 1342   MCH 28.1 08/21/2013 1342   MCHC 34.3 08/21/2013 1342   RDW 14.3 08/21/2013 1342   LYMPHSABS 2.2 08/21/2013 1342   MONOABS 0.3 08/21/2013 1342   EOSABS 0.0 08/21/2013 1342   BASOSABS 0.0 08/21/2013 1342     BNP    Component Value Date/Time   PROBNP 39.3 04/09/2012 1400    Lipid Panel  No results found for this basename: chol, trig, hdl, cholhdl, vldl, ldlcalc     RADIOLOGY: No results found.    ASSESSMENT AND PLAN:  Gary Underwood is a 68 year old gentleman who has mild CAD, history of chronic venous thromboembolic disease with prior DVT, hypertension, hyperlipidemia, and frontal lobe dementia.  He has experienced several falls over the past 3-6 months and recently had been hospitalized after a fall several weeks ago at [facility].  He does have mild orthostatic hypotension.  Presently.  Reducing his lisinopril from 40 mg to 10 mg, but we'll further titrate his Lasix to 40 mg twice a day for 2 days and then 20 mg twice a day.  I did obtain lower extremity doppler studies in the office today.  This showed a short segment of residual thrombus in the to distal femoral vein on the left, which was unchanged from the prior study of 12/19/2012.  I'm also giving him a prescription for cephalexin to take 500 mg twice a day for 10 days to reduce potential for possible early cellulitis.  He is diabetic and has had issues with significant hyperglycemia.  Followup blood work will be obtained.  He will  followup with Dr. Recardo Evangelist in several weeks.   Troy Sine, MD, [facility]  10/21/2013 6:47 PM

## 2013-11-01 NOTE — Telephone Encounter (Signed)
Encounter complete. 

## 2013-11-05 ENCOUNTER — Inpatient Hospital Stay (HOSPITAL_COMMUNITY): Payer: Medicare Other

## 2013-11-05 ENCOUNTER — Ambulatory Visit: Payer: Medicare Other | Admitting: Cardiology

## 2013-11-05 ENCOUNTER — Encounter: Payer: Self-pay | Admitting: Cardiology

## 2013-11-05 ENCOUNTER — Ambulatory Visit (INDEPENDENT_AMBULATORY_CARE_PROVIDER_SITE_OTHER): Payer: Medicare Other | Admitting: Cardiology

## 2013-11-05 ENCOUNTER — Inpatient Hospital Stay (HOSPITAL_COMMUNITY)
Admission: AD | Admit: 2013-11-05 | Discharge: 2013-11-09 | DRG: 684 | Disposition: A | Discharge status: Skilled Nursing Facility | Payer: Medicare Other | Source: Ambulatory Visit | Attending: Cardiovascular Disease | Admitting: Cardiovascular Disease

## 2013-11-05 ENCOUNTER — Encounter (HOSPITAL_COMMUNITY): Payer: Self-pay | Admitting: Nurse Practitioner

## 2013-11-05 VITALS — BP 100/66 | HR 95 | Ht 70.0 in | Wt 231.0 lb

## 2013-11-05 DIAGNOSIS — R5381 Other malaise: Secondary | ICD-10-CM | POA: Diagnosis present

## 2013-11-05 DIAGNOSIS — E785 Hyperlipidemia, unspecified: Secondary | ICD-10-CM

## 2013-11-05 DIAGNOSIS — R0600 Dyspnea, unspecified: Secondary | ICD-10-CM

## 2013-11-05 DIAGNOSIS — Z86718 Personal history of other venous thrombosis and embolism: Secondary | ICD-10-CM | POA: Diagnosis not present

## 2013-11-05 DIAGNOSIS — R0989 Other specified symptoms and signs involving the circulatory and respiratory systems: Secondary | ICD-10-CM

## 2013-11-05 DIAGNOSIS — I10 Essential (primary) hypertension: Secondary | ICD-10-CM

## 2013-11-05 DIAGNOSIS — L4050 Arthropathic psoriasis, unspecified: Secondary | ICD-10-CM | POA: Diagnosis present

## 2013-11-05 DIAGNOSIS — R600 Localized edema: Secondary | ICD-10-CM

## 2013-11-05 DIAGNOSIS — I82519 Chronic embolism and thrombosis of unspecified femoral vein: Secondary | ICD-10-CM | POA: Diagnosis present

## 2013-11-05 DIAGNOSIS — Z6830 Body mass index (BMI) 30.0-30.9, adult: Secondary | ICD-10-CM

## 2013-11-05 DIAGNOSIS — L408 Other psoriasis: Secondary | ICD-10-CM | POA: Diagnosis present

## 2013-11-05 DIAGNOSIS — Z7901 Long term (current) use of anticoagulants: Secondary | ICD-10-CM

## 2013-11-05 DIAGNOSIS — R627 Adult failure to thrive: Secondary | ICD-10-CM | POA: Diagnosis present

## 2013-11-05 DIAGNOSIS — I825Y9 Chronic embolism and thrombosis of unspecified deep veins of unspecified proximal lower extremity: Secondary | ICD-10-CM

## 2013-11-05 DIAGNOSIS — R609 Edema, unspecified: Secondary | ICD-10-CM

## 2013-11-05 DIAGNOSIS — R0609 Other forms of dyspnea: Secondary | ICD-10-CM

## 2013-11-05 DIAGNOSIS — R5383 Other fatigue: Secondary | ICD-10-CM

## 2013-11-05 DIAGNOSIS — C61 Malignant neoplasm of prostate: Secondary | ICD-10-CM | POA: Diagnosis present

## 2013-11-05 DIAGNOSIS — Z9181 History of falling: Secondary | ICD-10-CM

## 2013-11-05 DIAGNOSIS — R531 Weakness: Secondary | ICD-10-CM | POA: Insufficient documentation

## 2013-11-05 DIAGNOSIS — D72829 Elevated white blood cell count, unspecified: Secondary | ICD-10-CM | POA: Diagnosis present

## 2013-11-05 DIAGNOSIS — I82512 Chronic embolism and thrombosis of left femoral vein: Secondary | ICD-10-CM

## 2013-11-05 DIAGNOSIS — E119 Type 2 diabetes mellitus without complications: Secondary | ICD-10-CM

## 2013-11-05 DIAGNOSIS — N179 Acute kidney failure, unspecified: Secondary | ICD-10-CM | POA: Diagnosis present

## 2013-11-05 DIAGNOSIS — F0390 Unspecified dementia without behavioral disturbance: Secondary | ICD-10-CM

## 2013-11-05 DIAGNOSIS — I2510 Atherosclerotic heart disease of native coronary artery without angina pectoris: Secondary | ICD-10-CM

## 2013-11-05 DIAGNOSIS — R0602 Shortness of breath: Secondary | ICD-10-CM

## 2013-11-05 HISTORY — DX: Anxiety disorder, unspecified: F41.9

## 2013-11-05 HISTORY — DX: Malignant neoplasm of prostate: C61

## 2013-11-05 HISTORY — DX: Unspecified dementia, unspecified severity, without behavioral disturbance, psychotic disturbance, mood disturbance, and anxiety: F03.90

## 2013-11-05 LAB — COMPREHENSIVE METABOLIC PANEL
ALT: 28 U/L (ref 0–53)
AST: 18 U/L (ref 0–37)
Albumin: 3.2 g/dL — ABNORMAL LOW (ref 3.5–5.2)
Alkaline Phosphatase: 192 U/L — ABNORMAL HIGH (ref 39–117)
Anion gap: 18 — ABNORMAL HIGH (ref 5–15)
BUN: 28 mg/dL — ABNORMAL HIGH (ref 6–23)
CO2: 25 mEq/L (ref 19–32)
Calcium: 9.7 mg/dL (ref 8.4–10.5)
Chloride: 98 mEq/L (ref 96–112)
Creatinine, Ser: 1.75 mg/dL — ABNORMAL HIGH (ref 0.50–1.35)
GFR calc Af Amer: 45 mL/min — ABNORMAL LOW (ref >90)
GFR calc non Af Amer: 39 mL/min — ABNORMAL LOW (ref >90)
Glucose, Bld: 168 mg/dL — ABNORMAL HIGH (ref 70–99)
Potassium: 4.2 mEq/L (ref 3.7–5.3)
Sodium: 141 mEq/L (ref 137–147)
Total Bilirubin: 0.3 mg/dL (ref 0.3–1.2)
Total Protein: 6.2 g/dL (ref 6.0–8.3)

## 2013-11-05 LAB — CBC WITH DIFFERENTIAL/PLATELET
Basophils Absolute: 0.1 10*3/uL (ref 0.0–0.1)
Basophils Relative: 1 % (ref 0–1)
Eosinophils Absolute: 0.1 10*3/uL (ref 0.0–0.7)
Eosinophils Relative: 1 % (ref 0–5)
HCT: 43.3 % (ref 39.0–52.0)
Hemoglobin: 14 g/dL (ref 13.0–17.0)
Lymphocytes Relative: 15 % (ref 12–46)
Lymphs Abs: 1.9 10*3/uL (ref 0.7–4.0)
MCH: 28.2 pg (ref 26.0–34.0)
MCHC: 32.3 g/dL (ref 30.0–36.0)
MCV: 87.3 fL (ref 78.0–100.0)
Monocytes Absolute: 1.2 10*3/uL — ABNORMAL HIGH (ref 0.1–1.0)
Monocytes Relative: 9 % (ref 3–12)
Neutro Abs: 9.5 10*3/uL — ABNORMAL HIGH (ref 1.7–7.7)
Neutrophils Relative %: 74 % (ref 43–77)
Platelets: 168 10*3/uL (ref 150–400)
RBC: 4.96 MIL/uL (ref 4.22–5.81)
RDW: 15.3 % (ref 11.5–15.5)
WBC: 12.8 10*3/uL — ABNORMAL HIGH (ref 4.0–10.5)

## 2013-11-05 LAB — PROTIME-INR
INR: 2.82 — AB (ref 0.00–1.49)
PROTHROMBIN TIME: 29.7 s — AB (ref 11.6–15.2)

## 2013-11-05 LAB — PRO B NATRIURETIC PEPTIDE: Pro B Natriuretic peptide (BNP): 88.3 pg/mL (ref 0–125)

## 2013-11-05 LAB — APTT: aPTT: 35 seconds (ref 24–37)

## 2013-11-05 LAB — GLUCOSE, CAPILLARY
Glucose-Capillary: 137 mg/dL — ABNORMAL HIGH (ref 70–99)
Glucose-Capillary: 171 mg/dL — ABNORMAL HIGH (ref 70–99)

## 2013-11-05 LAB — TSH: TSH: 1.62 u[IU]/mL (ref 0.350–4.500)

## 2013-11-05 LAB — MAGNESIUM: Magnesium: 2 mg/dL (ref 1.5–2.5)

## 2013-11-05 LAB — TROPONIN I: Troponin I: <0.3 ng/mL (ref <0.30)

## 2013-11-05 MED ORDER — ACETAMINOPHEN 325 MG PO TABS: 650.0000 mg | ORAL_TABLET | ORAL | Status: DC | PRN

## 2013-11-05 MED ORDER — DEXMETHYLPHENIDATE HCL ER 20 MG PO CP24
40.0000 mg | ORAL_CAPSULE | Freq: Every day | ORAL | Status: DC
  Administered 2013-11-06: 40 mg via ORAL
  Filled 2013-11-05 (×2): qty 2

## 2013-11-05 MED ORDER — LAMOTRIGINE 200 MG PO TABS
200.0000 mg | ORAL_TABLET | Freq: Every day | ORAL | Status: DC
  Administered 2013-11-06 – 2013-11-09 (×4): 200 mg via ORAL
  Filled 2013-11-05 (×4): qty 1

## 2013-11-05 MED ORDER — ESCITALOPRAM OXALATE 20 MG PO TABS
20.0000 mg | ORAL_TABLET | Freq: Two times a day (BID) | ORAL | Status: DC
  Administered 2013-11-05 – 2013-11-09 (×8): 20 mg via ORAL
  Filled 2013-11-05 (×9): qty 1

## 2013-11-05 MED ORDER — LISINOPRIL 10 MG PO TABS
10.0000 mg | ORAL_TABLET | Freq: Every day | ORAL | Status: DC
  Administered 2013-11-06 – 2013-11-09 (×4): 10 mg via ORAL
  Filled 2013-11-05 (×4): qty 1

## 2013-11-05 MED ORDER — FINASTERIDE 5 MG PO TABS
5.0000 mg | ORAL_TABLET | Freq: Every day | ORAL | Status: DC
  Administered 2013-11-05 – 2013-11-09 (×5): 5 mg via ORAL
  Filled 2013-11-05 (×5): qty 1

## 2013-11-05 MED ORDER — ATORVASTATIN CALCIUM 80 MG PO TABS
80.0000 mg | ORAL_TABLET | Freq: Every day | ORAL | Status: DC
  Administered 2013-11-05 – 2013-11-08 (×4): 80 mg via ORAL
  Filled 2013-11-05 (×5): qty 1

## 2013-11-05 MED ORDER — TRAZODONE HCL 100 MG PO TABS
200.0000 mg | ORAL_TABLET | Freq: Every day | ORAL | Status: DC
  Administered 2013-11-05 – 2013-11-08 (×4): 200 mg via ORAL
  Filled 2013-11-05 (×5): qty 2

## 2013-11-05 MED ORDER — INSULIN ASPART 100 UNIT/ML ~~LOC~~ SOLN: 0.0000 [IU] | Freq: Every day | SUBCUTANEOUS | Status: DC

## 2013-11-05 MED ORDER — TAMSULOSIN HCL 0.4 MG PO CAPS
0.4000 mg | ORAL_CAPSULE | Freq: Every day | ORAL | Status: DC
  Administered 2013-11-05 – 2013-11-09 (×5): 0.4 mg via ORAL
  Filled 2013-11-05 (×5): qty 1

## 2013-11-05 MED ORDER — ONDANSETRON HCL 4 MG/2ML IJ SOLN: 4.0000 mg | Freq: Four times a day (QID) | INTRAMUSCULAR | Status: DC | PRN

## 2013-11-05 MED ORDER — INSULIN ASPART 100 UNIT/ML ~~LOC~~ SOLN
0.0000 [IU] | Freq: Three times a day (TID) | SUBCUTANEOUS | Status: DC
  Administered 2013-11-05 – 2013-11-06 (×3): 1 [IU] via SUBCUTANEOUS
  Administered 2013-11-06 – 2013-11-07 (×3): 2 [IU] via SUBCUTANEOUS
  Administered 2013-11-07: 1 [IU] via SUBCUTANEOUS
  Administered 2013-11-08: 2 [IU] via SUBCUTANEOUS
  Administered 2013-11-09 (×2): 1 [IU] via SUBCUTANEOUS

## 2013-11-05 MED ORDER — ASPIRIN EC 81 MG PO TBEC
81.0000 mg | DELAYED_RELEASE_TABLET | Freq: Every day | ORAL | Status: DC
  Administered 2013-11-06 – 2013-11-09 (×4): 81 mg via ORAL
  Filled 2013-11-05 (×4): qty 1

## 2013-11-05 MED ORDER — HYDROXYZINE PAMOATE 50 MG PO CAPS
100.0000 mg | ORAL_CAPSULE | Freq: Every day | ORAL | Status: DC
  Administered 2013-11-05 – 2013-11-08 (×4): 100 mg via ORAL
  Filled 2013-11-05 (×5): qty 2

## 2013-11-05 MED ORDER — WARFARIN - PHARMACIST DOSING INPATIENT: Freq: Every day | Status: DC

## 2013-11-05 MED ORDER — ROPINIROLE HCL 1 MG PO TABS
1.0000 mg | ORAL_TABLET | Freq: Every day | ORAL | Status: DC
  Administered 2013-11-05 – 2013-11-08 (×4): 1 mg via ORAL
  Filled 2013-11-05 (×5): qty 1

## 2013-11-05 MED ORDER — NITROGLYCERIN 0.4 MG SL SUBL: 0.4000 mg | SUBLINGUAL_TABLET | SUBLINGUAL | Status: DC | PRN

## 2013-11-05 MED ORDER — BUPROPION HCL ER (XL) 300 MG PO TB24
300.0000 mg | ORAL_TABLET | Freq: Every day | ORAL | Status: DC
  Administered 2013-11-06 – 2013-11-09 (×4): 300 mg via ORAL
  Filled 2013-11-05 (×5): qty 1

## 2013-11-05 MED ORDER — FUROSEMIDE 20 MG PO TABS
20.0000 mg | ORAL_TABLET | Freq: Two times a day (BID) | ORAL | Status: DC
  Administered 2013-11-05 – 2013-11-06 (×3): 20 mg via ORAL
  Filled 2013-11-05 (×4): qty 1

## 2013-11-05 MED ORDER — METFORMIN HCL ER 500 MG PO TB24
1000.0000 mg | ORAL_TABLET | Freq: Every day | ORAL | Status: DC
  Administered 2013-11-06: 1000 mg via ORAL
  Filled 2013-11-05 (×2): qty 2

## 2013-11-05 NOTE — H&P (Signed)
I saw & examined the patient in clinic along with Mr. Rosalyn Gess, [place].  Very complicated situation.  He is clearly failure to thrive, but does not really seem to be volume overloaded.   He will need an Echo & CXR to assess hypoxia/dyspnea - diuresis is likely indicated. He needs PT/OT evaluation & SW consult, as I truly believe he will need at least short term placement.   Definite strain in family dynamics.   I agree with what amounts to be a FTT admission.  Leonie Man, MD

## 2013-11-05 NOTE — Progress Notes (Signed)
11/05/2013 Mikel Cella   Mar 10, 1946  [id number]  Primary Physicia [name], MD Primary Cardiologist: Dr Claiborne Billings  HPI:  Mr. 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 He had a low risk Myoview May 2015. He has a history of hypertension, and hyperlipidemia. And diabetes. He also has a history of psoriasis with psoriatic arthritis. In 2011 he developed pericarditis with large pericardial effusion which improved with treatment. 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             He recently has had increasing dyspnea and failure to thrive at home. He still lives alone with family help. He also has had some blood pressure lability. He has seen several providers over the past few months. He also had several falls over the past 3-6 months. 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 The family is quit frustrated that he continues to decline. They brought him to the office today for further evaluation. They tell me they called EMS to help get him in the car.     Current Outpatient Prescriptions  Medication Sig Dispense Refill  . ALPRAZolam (XANAX) 1 MG tablet Take 1 mg by mouth 2 (two) times daily.      Marland Kitchen buPROPion (WELLBUTRIN XL) 300 MG 24 hr tablet Take 300 mg by mouth daily.      Marland Kitchen Dexmethylphenidate HCl (FOCALIN XR) 40 MG CP24 Take 40 mg by mouth daily.      Marland Kitchen escitalopram (LEXAPRO) 20 MG tablet Take 20 mg by mouth 2 (two) times daily.      . finasteride (PROSCAR) 5 MG tablet Take 5 mg by mouth daily.      . furosemide (LASIX) 40 MG tablet Take 20 mg by mouth 2 (two) times daily.      . hydrOXYzine (VISTARIL) 50 MG capsule Take 100 mg by mouth at bedtime.      . lamoTRIgine (LAMICTAL) 200 MG tablet Take 200 mg by mouth daily.        Marland Kitchen lisinopril (PRINIVIL,ZESTRIL) 10 MG tablet Take 1 tablet (10 mg total) by mouth daily.  90 tablet  3  . metFORMIN (GLUCOPHAGE-XR) 500 MG 24 hr tablet Take 1,000 mg by mouth daily.      Marland Kitchen rOPINIRole (REQUIP) 1 MG tablet Take 1 mg by mouth at bedtime.      . rosuvastatin (CRESTOR) 40 MG tablet Take 40 mg by mouth daily.      . tamsulosin (FLOMAX) 0.4 MG CAPS capsule Take 0.4 mg by mouth daily.      . traZODone (DESYREL) 100 MG tablet Take 200 mg by mouth at bedtime.      Marland Kitchen Ustekinumab (STELARA) 90 MG/ML SOLN Inject 90 mg into the skin every 3 (three) months.       . warfarin (COUMADIN) 5 MG tablet Take 1 tablet (5 mg total) by mouth daily.  15 tablet  0   No current facility-administered medications for this visit.    No Known Allergies  History   Social History  . Marital Status: Divorced    Spouse Name: N/A    Number of Children: 1  . Years of Education: college   Occupational History  . disabled    Social History Main Topics  . Smoking status: Never Smoker   .  Smokeless tobacco: Never Used  . Alcohol Use: No  . Drug Use: No  . Sexual Activity: Not on file   Other Topics Concern  . Not on file   Social History Narrative  . No narrative on file    FamHx- remarkable for stroke   Review of Systems: General: negative for chills, fever, night sweats or weight changes.  Cardiovascular: negative for chest pain, dyspnea on exertion, edema, orthopnea, palpitations, paroxysmal nocturnal dyspnea or shortness of breath Dermatological: negative for rash Respiratory: negative for cough or wheezing Urologic: negative for hematuria Abdominal: negative for nausea, vomiting, diarrhea, bright red blood per rectum, melena, or hematemesis Neurologic: negative for visual changes, syncope, or dizziness CTA was negative for PE in April 2015. All other systems reviewed and are otherwise negative except as noted above.    Blood pressure 100/66, pulse 95, height 5\' 10"  (1.778 m),  weight 231 lb (104.781 kg).  General appearance: alert, cooperative, no distress and disheveled Neck: no carotid bruit and no JVD Lungs: few crackles Lt base Heart: regular rate and rhythm Abdomen: soft, non-tender; bowel sounds normal; no masses,  no organomegaly Extremities: Lt LE edema Pulses: diminnished Skin: pale cool dry Neurologic: Grossly normal  EKG NSR  ASSESSMENT AND PLAN:   Dyspnea on exertion This has been progressive, O2 sats at home in the 70s at times per family.  Weakness This is the major concern for the family, he basically can't care for himself anymore.  Dementia History of frontal lobe dementia  HTN (hypertension) Labile at home, some ortho stasis per family  DVT, femoral, chronic Recent venous doppler shows no change  Chronic anticoagulation, on coumadin for DVT Coumadin Rx  CAD- mild by cath 2012 Low risk Myoview May 2015  Type 2 diabetes mellitus .  Bilateral leg edema Improved since last I saw him   PLAN  It seems the most prudent thing to do would be to admit him for evaluation. I suspect he needs SNF. Check labs and echo, PT and social work consult (ther is defiantly some family dynamics here with the pt and family arguing).   [facility] KPA-C 11/05/2013 12:39 PM

## 2013-11-05 NOTE — Patient Instructions (Signed)
Patient is being admitted to [facility]. Please go to the main entrance - [place] - to the admitting department.

## 2013-11-05 NOTE — Progress Notes (Signed)
  Echocardiogram 2D Echocardiogram has been performed.  Gary Underwood 11/05/2013, 5:38 PM

## 2013-11-05 NOTE — Assessment & Plan Note (Signed)
History of frontal lobe dementia

## 2013-11-05 NOTE — Assessment & Plan Note (Signed)
Labile at home, some ortho stasis per family

## 2013-11-05 NOTE — Assessment & Plan Note (Signed)
Low risk Myoview May 2015

## 2013-11-05 NOTE — H&P (Signed)
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 He had a low risk Myoview May 2015. He has a history of hypertension, and hyperlipidemia. And diabetes. He also has a history of psoriasis with psoriatic arthritis. In 2011 he developed pericarditis with large pericardial effusion which improved with treatment. 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  He recently has had increasing dyspnea and failure to thrive at home. He still lives alone with family help. He also has had some blood pressure lability. He has seen several providers over the past few months. He also had several falls over the past 3-6 months. 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 The family is quit frustrated that he continues to decline. They brought him to the office today for further evaluation. They tell me they called EMS to help get him in the car.   Current Outpatient Prescriptions   Medication  Sig  Dispense  Refill   .  ALPRAZolam (XANAX) 1 MG tablet  Take 1 mg by mouth 2 (two) times daily.     Marland Kitchen  buPROPion (WELLBUTRIN XL) 300 MG 24 hr tablet  Take 300 mg by mouth daily.     Marland Kitchen  Dexmethylphenidate HCl (FOCALIN XR) 40 MG CP24  Take 40 mg by mouth daily.     Marland Kitchen  escitalopram (LEXAPRO) 20 MG tablet  Take 20 mg by mouth 2 (two) times daily.     .  finasteride (PROSCAR) 5 MG tablet  Take 5 mg by mouth daily.     .  furosemide (LASIX) 40 MG tablet  Take 20 mg by mouth 2 (two) times daily.     .  hydrOXYzine (VISTARIL) 50 MG capsule  Take 100 mg by mouth at bedtime.     .  lamoTRIgine (LAMICTAL) 200 MG tablet  Take 200 mg by mouth daily.     Marland Kitchen   lisinopril (PRINIVIL,ZESTRIL) 10 MG tablet  Take 1 tablet (10 mg total) by mouth daily.  90 tablet  3   .  metFORMIN (GLUCOPHAGE-XR) 500 MG 24 hr tablet  Take 1,000 mg by mouth daily.     Marland Kitchen  rOPINIRole (REQUIP) 1 MG tablet  Take 1 mg by mouth at bedtime.     .  rosuvastatin (CRESTOR) 40 MG tablet  Take 40 mg by mouth daily.     .  tamsulosin (FLOMAX) 0.4 MG CAPS capsule  Take 0.4 mg by mouth daily.     .  traZODone (DESYREL) 100 MG tablet  Take 200 mg by mouth at bedtime.     Marland Kitchen  Ustekinumab (STELARA) 90 MG/ML SOLN  Inject 90 mg into the skin every 3 (three) months.     .  warfarin (COUMADIN) 5 MG tablet  Take 1 tablet (5 mg total) by mouth daily.  15 tablet  0    No current facility-administered medications for this visit.   No Known Allergies  History    Social History   .  Marital Status:  Divorced     Spouse Name:  N/A     Number of Children:  1   .  Years of Education:  college  Occupational History   .  disabled     Social History Main Topics   .  Smoking status:  Never Smoker   .  Smokeless tobacco:  Never Used   .  Alcohol Use:  No   .  Drug Use:  No   .  Sexual Activity:  Not on file    Other Topics  Concern   .  Not on file    Social History Narrative   .  No narrative on file   FamHx- remarkable for stroke   Review of Systems:  General: negative for chills, fever, night sweats or weight changes.  Cardiovascular: negative for chest pain, dyspnea on exertion, edema, orthopnea, palpitations, paroxysmal nocturnal dyspnea or shortness of breath  Dermatological: negative for rash  Respiratory: negative for cough or wheezing  Urologic: negative for hematuria  Abdominal: negative for nausea, vomiting, diarrhea, bright red blood per rectum, melena, or hematemesis  Neurologic: negative for visual changes, syncope, or dizziness  CTA was negative for PE in April 2015.  Told recently he has small back fractures from falls at home. All other systems reviewed and are  otherwise negative except as noted above.    PE Blood pressure 100/66, pulse 95, height 5\' 10"  (1.778 m), weight 231 lb (104.781 kg).  General appearance: alert, cooperative, no distress and disheveled  Neck: no carotid bruit and no JVD  Lungs: few crackles Lt base  Heart: regular rate and rhythm  Abdomen: soft, non-tender; bowel sounds normal; no masses, no organomegaly  Extremities: Lt LE edema  Pulses: diminnished  Skin: pale cool dry  Neurologic: Grossly normal   EKG NSR   ASSESSMENT AND PLAN:   Dyspnea on exertion and failure to thrive This has been progressive, O2 sats at home in the 70s at times per family.  Weakness  This is the major concern for the family, he basically can't care for himself anymore.  Dementia  History of frontal lobe dementia  HTN (hypertension)  Labile at home, some ortho stasis per family  DVT, femoral, chronic  Recent venous doppler shows no change  Chronic anticoagulation, on coumadin for DVT  Coumadin Rx  CAD- mild by cath 2012  Low risk Myoview May 2015  Type 2 diabetes mellitus   on oral medications Bilateral leg edema  Improved since last I saw him   PLAN It seems the most prudent thing to do would be to admit him for evaluation. I suspect he needs SNF. Check labs and echo, PT and social work consult (ther is defiantly some family dynamics here with the pt and family arguing).  [facility]  11/05/2013  12:39 PM

## 2013-11-05 NOTE — Progress Notes (Signed)
ANTICOAGULATION CONSULT NOTE - Initial Consult  Pharmacy Consult for Coumadin  Indication: H/o DVT   No Known Allergies  Patient Measurements: Height: 5\' 10"  (177.8 cm) Weight: 217 lb 9.5 oz (98.7 kg) IBW/kg (Calculated) : 73 Heparin Dosing Weight: n/a  Vital Signs: Temp: 98.5 F (36.9 C) (07/13 1611) Temp src: Oral (07/13 1611) BP: 92/54 mmHg (07/13 1611) Pulse Rate: 91 (07/13 1611)  Labs: No results found for this basename: HGB, HCT, PLT, APTT, LABPROT, INR, HEPARINUNFRC, CREATININE, CKTOTAL, CKMB, TROPONINI,  in the last 72 hours  Estimated Creatinine Clearance: 84.5 ml/min (by C-G formula based on Cr of 1).   Medical History: Past Medical History  Diagnosis Date  . Psoriasis   . Hypertension   . DVT (deep venous thrombosis)     LLE  . Acute myopericarditis     P/H 2011  . Coronary atherosclerosis     found on CT scan  . Hyperlipidemia   . Major depression   . Memory deficit 07/25/2013  . Restless legs syndrome (RLS)   . CAD (coronary artery disease) 08/24/2013  . Type 2 diabetes mellitus 10/21/2013  . Dementia   . Cancer     prostate cancer  . Anxiety     Medications:  Prescriptions prior to admission  Medication Sig Dispense Refill  . ALPRAZolam (XANAX) 1 MG tablet Take 1 mg by mouth 2 (two) times daily.      Marland Kitchen buPROPion (WELLBUTRIN XL) 300 MG 24 hr tablet Take 300 mg by mouth daily.      Marland Kitchen Dexmethylphenidate HCl (FOCALIN XR) 40 MG CP24 Take 40 mg by mouth daily.      Marland Kitchen escitalopram (LEXAPRO) 20 MG tablet Take 20 mg by mouth 2 (two) times daily.      . finasteride (PROSCAR) 5 MG tablet Take 5 mg by mouth daily.      . furosemide (LASIX) 40 MG tablet Take 20 mg by mouth 2 (two) times daily.      . hydrOXYzine (VISTARIL) 50 MG capsule Take 100 mg by mouth at bedtime.      . lamoTRIgine (LAMICTAL) 200 MG tablet Take 200 mg by mouth daily.      Marland Kitchen lisinopril (PRINIVIL,ZESTRIL) 10 MG tablet Take 1 tablet (10 mg total) by mouth daily.  90 tablet  3  . metFORMIN  (GLUCOPHAGE-XR) 500 MG 24 hr tablet Take 1,000 mg by mouth daily.      Marland Kitchen rOPINIRole (REQUIP) 1 MG tablet Take 1 mg by mouth at bedtime.      . rosuvastatin (CRESTOR) 40 MG tablet Take 40 mg by mouth daily.      . tamsulosin (FLOMAX) 0.4 MG CAPS capsule Take 0.4 mg by mouth daily.      . traZODone (DESYREL) 100 MG tablet Take 200 mg by mouth at bedtime.      Marland Kitchen warfarin (COUMADIN) 5 MG tablet Take 5-7.5 mg by mouth daily. Take 5 mg every day, Take an additional 2.5 mg on Mondays and Wednesdays, for a total of 7.5 mg.      . Ustekinumab (STELARA) 90 MG/ML SOLN Inject 90 mg into the skin every 3 (three) months.         Assessment: 51 YOM who was a direct admit for increasing dyspnea and failure to thrive. Pharmacy consulted to resume coumadin dosing for chronic DVT. INR today was supra-therapeutic at 3.32. H/H wnl, plt low at 143.   Home Coumadin dose: 7.5 mg on Monday and Wednesday, 5 mg on all  other days   Goal of Therapy:  INR 2-3 Monitor platelets by anticoagulation protocol: Yes   Plan:  -Hold coumadin today -Monitor daily PT/INR and s/s of bleeding   Albertina Parr, PharmD.  Clinical Pharmacist Pager [phone]

## 2013-11-05 NOTE — Assessment & Plan Note (Signed)
Improved since last I saw him

## 2013-11-05 NOTE — Assessment & Plan Note (Signed)
Recent venous doppler shows no change

## 2013-11-05 NOTE — Assessment & Plan Note (Signed)
This is the major concern for the family, he basically can't care for himself anymore.

## 2013-11-05 NOTE — Assessment & Plan Note (Signed)
Coumadin Rx 

## 2013-11-05 NOTE — Assessment & Plan Note (Addendum)
This has been progressive, O2 sats at home in the 70s at times per family.

## 2013-11-05 NOTE — Progress Notes (Signed)
Gary Underwood paged about patient arriving to the unit.

## 2013-11-06 DIAGNOSIS — R0602 Shortness of breath: Secondary | ICD-10-CM

## 2013-11-06 LAB — GLUCOSE, CAPILLARY
GLUCOSE-CAPILLARY: 130 mg/dL — AB (ref 70–99)
Glucose-Capillary: 186 mg/dL — ABNORMAL HIGH (ref 70–99)
Glucose-Capillary: 121 mg/dL — ABNORMAL HIGH (ref 70–99)
Glucose-Capillary: 189 mg/dL — ABNORMAL HIGH (ref 70–99)

## 2013-11-06 LAB — BASIC METABOLIC PANEL
Anion gap: 21 — ABNORMAL HIGH (ref 5–15)
BUN: 23 mg/dL (ref 6–23)
CO2: 23 mEq/L (ref 19–32)
Calcium: 9.3 mg/dL (ref 8.4–10.5)
Chloride: 98 mEq/L (ref 96–112)
Creatinine, Ser: 1.34 mg/dL (ref 0.50–1.35)
GFR calc Af Amer: 62 mL/min — ABNORMAL LOW (ref >90)
GFR, EST NON AFRICAN AMERICAN: 53 mL/min — AB (ref >90)
Glucose, Bld: 160 mg/dL — ABNORMAL HIGH (ref 70–99)
POTASSIUM: 4.1 meq/L (ref 3.7–5.3)
SODIUM: 142 meq/L (ref 137–147)

## 2013-11-06 LAB — CBC
HCT: 41.7 % (ref 39.0–52.0)
Hemoglobin: 13.8 g/dL (ref 13.0–17.0)
MCH: 28.5 pg (ref 26.0–34.0)
MCHC: 33.1 g/dL (ref 30.0–36.0)
MCV: 86 fL (ref 78.0–100.0)
PLATELETS: 166 10*3/uL (ref 150–400)
RBC: 4.85 MIL/uL (ref 4.22–5.81)
RDW: 14.8 % (ref 11.5–15.5)
WBC: 14.8 10*3/uL — AB (ref 4.0–10.5)

## 2013-11-06 LAB — PROTIME-INR
INR: 2.61 — ABNORMAL HIGH (ref 0.00–1.49)
PROTHROMBIN TIME: 27.9 s — AB (ref 11.6–15.2)

## 2013-11-06 MED ORDER — ALPRAZOLAM 0.5 MG PO TABS
1.0000 mg | ORAL_TABLET | Freq: Two times a day (BID) | ORAL | Status: DC
  Administered 2013-11-06 – 2013-11-09 (×7): 1 mg via ORAL
  Filled 2013-11-06 (×8): qty 2

## 2013-11-06 MED ORDER — WARFARIN SODIUM 6 MG PO TABS
6.0000 mg | ORAL_TABLET | Freq: Once | ORAL | Status: AC
  Administered 2013-11-06: 6 mg via ORAL
  Filled 2013-11-06: qty 1

## 2013-11-06 MED ORDER — FUROSEMIDE 20 MG PO TABS
20.0000 mg | ORAL_TABLET | Freq: Every day | ORAL | Status: DC
  Administered 2013-11-07 – 2013-11-09 (×3): 20 mg via ORAL
  Filled 2013-11-06 (×3): qty 1

## 2013-11-06 MED ORDER — DEXMETHYLPHENIDATE HCL ER 20 MG PO CP24
40.0000 mg | ORAL_CAPSULE | Freq: Every day | ORAL | Status: DC
  Administered 2013-11-07 – 2013-11-09 (×3): 40 mg via ORAL
  Filled 2013-11-06 (×3): qty 2

## 2013-11-06 NOTE — Progress Notes (Signed)
ANTICOAGULATION CONSULT NOTE - FOLLOW UP   Pharmacy Consult:  Coumadin  Indication: History of DVT   No Known Allergies  Patient Measurements: Height: 5\' 10"  (177.8 cm) Weight: 216 lb 14.4 oz (98.385 kg) (b scale) IBW/kg (Calculated) : 73  Vital Signs: Temp: 97.7 F (36.5 C) (07/14 0852) Temp src: Oral (07/14 0852) BP: 118/97 mmHg (07/14 0852) Pulse Rate: 85 (07/14 0852)  Labs:  Recent Labs  11/05/13 1726 11/05/13 2055 11/06/13 0411  HGB 14.0  --   --   HCT 43.3  --   --   PLT 168  --   --   APTT  --  35  --   LABPROT  --  29.7* 27.9*  INR  --  2.82* 2.61*  CREATININE 1.75*  --   --   TROPONINI <0.30  --   --     Estimated Creatinine Clearance: 48.2 ml/min (by C-G formula based on Cr of 1.75).     Assessment: 66 YOM who was a direct admit for increasing dyspnea and failure to thrive. Pharmacy consulted to resume coumadin dosing for chronic DVT.  INR decreased to therapeutic level post Coumadin held yesterday.  No bleeding reported.   Goal of Therapy:  INR 2-3 Monitor platelets by anticoagulation protocol: Yes    Plan:  - Coumadin 6mg  PO today - Daily PT / INR    Rozanne Heumann D. Mina Marble, PharmD, BCPS Pager:  [phone] 11/06/2013, 10:55 AM

## 2013-11-06 NOTE — Progress Notes (Signed)
The patient had dyspnea on exertion overnight.  He is still on RA.  He tried to get out of the bed without assistance and was reminded to call the nurse or use his call bell when he needs to get out of bed.  The bed alarm was kept on overnight.  He did not have any complaints of pain and did not receive any PRN medications.

## 2013-11-06 NOTE — Evaluation (Signed)
Physical Therapy Evaluation Patient Details Name: Gary Underwood MRN: [id number] DOB: 1945/08/13 Today's Date: 11/06/2013   History of Present Illness  Patient is a 67 y/o male admitted with increased dyspnea and failure to thrive at home. 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    Clinical Impression  Patient presents with generalized weakness, balance deficits and impaired functional mobility secondary to problems listed in PT problem list. Pt becomes SOB with minimal exercise. Pt with poor safety awareness and history of multiple falls. Concerned about pt's ability to safely care for self at home. Pt would benefit from skilled therapy in acute care setting to maximize independence and reduce risk of falls in ST-SNF prior to discharge home.     Follow Up Recommendations SNF;Supervision/Assistance - 24 hour    Equipment Recommendations  None recommended by PT    Recommendations for Other Services       Precautions / Restrictions Precautions Precautions: Fall Restrictions Weight Bearing Restrictions: No      Mobility  Bed Mobility Overal bed mobility: Needs Assistance Bed Mobility: Supine to Sit     Supine to sit: Supervision;HOB elevated     General bed mobility comments: use of handrail for support, increased time.  Transfers Overall transfer level: Needs assistance Equipment used: Rolling walker (2 wheeled) Transfers: Sit to/from Stand Sit to Stand: Min guard         General transfer comment: VC for technique, hand placement and anterior translation. Able to stand with use of UE support progressing to no UE for push off.  Ambulation/Gait Ambulation/Gait assistance: Min guard Ambulation Distance (Feet): 75 Feet Assistive device: Rolling walker (2 wheeled) Gait Pattern/deviations:  Trunk flexed;Drifts right/left;Decreased stance time - right;Decreased step length - left   Gait velocity interpretation: Below normal speed for age/gender General Gait Details: Difficulty with WB through RLE with increased distance. Half way through ambulation, pt pushed away RW, "i don't use this at home" and immediately needed to grab onto hand rail for support for balance.  Stairs            Wheelchair Mobility    Modified Rankin (Stroke Patients Only)       Balance Overall balance assessment: Needs assistance Sitting-balance support: Feet supported;No upper extremity supported Sitting balance-Leahy Scale: Good     Standing balance support: Single extremity supported;During functional activity Standing balance-Leahy Scale: Poor Standing balance comment: USe RW initially for support progressing to hand rail for support, unsteady without support with staggering noted.                             Pertinent Vitals/Pain Reports pain in right hip with ambulation. Not rated, however pt repositioned and RN made aware. SOB present with exertion. Sp02 90% post ambulation bout and HR 116 bpm.    Home Living Family/patient expects to be discharged to:: Private residence Living Arrangements: Alone (Reports his daughter lives next door.) Available Help at Discharge: Family;Available PRN/intermittently Type of Home: House Home Access: Level entry     Home Layout: One level Home Equipment: Walker - 2 wheels;Cane - single point;Bedside commode;Wheelchair - manual      Prior Function Level of Independence: Needs assistance   Gait / Transfers Assistance Needed: Reports using SPC for ambulation. Reports multiple falls at home.  ADL's / Homemaking Assistance Needed: Reports  assist with ADLs from daughter at times. Daughter provides meals and drives pt to appointments.  Comments: Questionable historian. No family members present to corroborate reported PLOF/history.      Hand Dominance        Extremity/Trunk Assessment   Upper Extremity Assessment: Overall WFL for tasks assessed           Lower Extremity Assessment: Generalized weakness;RLE deficits/detail RLE Deficits / Details: AROM limited secondary to pain in R hip during testing.        Communication   Communication: HOH  Cognition Arousal/Alertness: Awake/alert Behavior During Therapy: WFL for tasks assessed/performed Overall Cognitive Status: No family/caregiver present to determine baseline cognitive functioning Area of Impairment: Safety/judgement;Orientation Orientation Level: Disoriented to;Time;Situation ("My daughter made my come here" "Let's see, June... well the 4th was on a saturday")       Safety/Judgement: Decreased awareness of safety     General Comments: Moves RW out of way in hallway when turning and immediately staggers and reaches out to grab onto handrail for support.    General Comments General comments (skin integrity, edema, etc.): Bruising present BLEs distal to knee, "I fall a lot"    Exercises General Exercises - Lower Extremity Ankle Circles/Pumps: Both;10 reps;Seated Hip Flexion/Marching: Both;5 reps;Seated Other Exercises Other Exercises: Performed sit <-> stand x7 for strengthening from different bed heights with use of UEs for support progressing to no UE push off upon standing. VC for technique. Rest breaks between reps.      Assessment/Plan    PT Assessment Patient needs continued PT services  PT Diagnosis Generalized weakness;Difficulty walking   PT Problem List Decreased strength;Cardiopulmonary status limiting activity;Pain;Decreased range of motion;Decreased cognition;Decreased activity tolerance;Decreased knowledge of use of DME;Decreased balance;Decreased safety awareness;Decreased mobility;Decreased knowledge of precautions  PT Treatment Interventions DME instruction;Balance training;Gait training;Cognitive remediation;Functional  mobility training;Patient/family education;Therapeutic activities;Therapeutic exercise   PT Goals (Current goals can be found in the Care Plan section) Acute Rehab PT Goals Patient Stated Goal: to go home PT Goal Formulation: With patient Time For Goal Achievement: 11/20/13 Potential to Achieve Goals: Good    Frequency Min 2X/week   Barriers to discharge Decreased caregiver support Pt lives alone.    Co-evaluation               End of Session Equipment Utilized During Treatment: Gait belt Activity Tolerance: Patient limited by pain (Pain in right hip.) Patient left: in bed;with call bell/phone within reach;with bed alarm set;with nursing/sitter in room Nurse Communication: Mobility status;Precautions         Time: 3151-7616 PT Time Calculation (min): 23 min   Charges:   PT Evaluation $Initial PT Evaluation Tier I: 1 Procedure PT Treatments $Therapeutic Activity: 8-22 mins   PT G CodesCandy Sledge A 11/06/2013, 11:04 AM Candy Sledge, PT, DPT [phone]

## 2013-11-06 NOTE — Progress Notes (Signed)
PHARMACIST - PHYSICIAN COMMUNICATION DR:  Claiborne Billings CONCERNING:  METFORMIN SAFE ADMINISTRATION POLICY  RECOMMENDATION: Metformin has been placed on DISCONTINUE (rejected order) STATUS and should be reordered only after any of the conditions below are ruled out.     * Will watch CBGs / SCr and reorder metformin as appropriate. * Consider changing SSI to moderate or resistance scale.  Current safety recommendations include avoiding metformin for a minimum of 48 hours after the patient's exposure to intravenous contrast media.  DESCRIPTION:  The Pharmacy Committee has adopted a policy that restricts the use of metformin in hospitalized patients until all the contraindications to administration have been ruled out. Specific contraindications are: [x]  Serum creatinine ? 1.5 for males []  Serum creatinine ? 1.4 for females []  Shock, acute MI, sepsis, hypoxemia, dehydration []  Planned administration of intravenous iodinated contrast media []  Heart Failure patients with low EF []  Acute or chronic metabolic acidosis (including DKA)   Shirley Decamp D. Mina Marble, PharmD, BCPS Pager:  [phone] 11/06/2013, 11:01 AM

## 2013-11-06 NOTE — Progress Notes (Signed)
Patient Name: Gary Underwood Date of Encounter: 11/06/2013     Active Problems:   Dyspnea    SUBJECTIVE  No CP or SOB. He gets weak and lightheaded and slightly SOB when he tries to walk. Walked with walker with PT today and easily SOB with minimal activity. Biggest complaint is right leg pain .  CURRENT MEDS . aspirin EC  81 mg Oral Daily  . atorvastatin  80 mg Oral q1800  . buPROPion  300 mg Oral Daily  . dexmethylphenidate  40 mg Oral Daily  . escitalopram  20 mg Oral BID  . finasteride  5 mg Oral Daily  . furosemide  20 mg Oral BID  . hydrOXYzine  100 mg Oral QHS  . insulin aspart  0-5 Units Subcutaneous QHS  . insulin aspart  0-9 Units Subcutaneous TID WC  . lamoTRIgine  200 mg Oral Daily  . lisinopril  10 mg Oral Daily  . rOPINIRole  1 mg Oral QHS  . tamsulosin  0.4 mg Oral Daily  . traZODone  200 mg Oral QHS  . warfarin  6 mg Oral ONCE-1800  . Warfarin - Pharmacist Dosing Inpatient   Does not apply q1800    OBJECTIVE  Filed Vitals:   11/05/13 2132 11/06/13 0210 11/06/13 0528 11/06/13 0852  BP: 116/58 124/62 99/56 118/97  Pulse: 80 84 79 85  Temp: 98.7 F (37.1 C) 97.4 F (36.3 C) 99.2 F (37.3 C) 97.7 F (36.5 C)  TempSrc: Oral Oral Oral Oral  Resp: _0 Height:      Weight:   216 lb 14.4 oz (98.385 kg)   SpO2: 94% 96% 95% 96%    Intake/Output Summary (Last 24 hours) at 11/06/13 1136 Last data filed at 11/06/13 0813  Gross per 24 hour  Intake    480 ml  Output   1350 ml  Net   -870 ml   Filed Weights   11/05/13 1300 11/05/13 1615 11/06/13 0528  Weight: 231 lb 0.7 oz (104.8 kg) 217 lb 9.5 oz (98.7 kg) 216 lb 14.4 oz (98.385 kg)    PHYSICAL EXAM  General: Pleasant, NAD. Appears older than stated age. Frail and disheveled.  Neuro: Alert and oriented X 3. Moves all extremities spontaneously. Psych: Normal affect. HEENT:  Normal  Neck: Supple without bruits or JVD. Lungs:  Resp regular and unlabored, CTA. Heart: RRR no s3, s4, or  murmurs. Abdomen: Soft, non-tender, non-distended, BS + x 4.  Extremities: No clubbing, cyanosis or edema. DP/PT/Radials 2+ and equal bilaterally. Bruising present BLEs distal to knee from freq falls  Accessory Clinical Findings  CBC  Recent Labs  11/05/13 1726  WBC 12.8*  NEUTROABS 9.5*  HGB 14.0  HCT 43.3  MCV 87.3  PLT 588   Basic Metabolic Panel  Recent Labs  11/05/13 1726  NA 141  K 4.2  CL 98  CO2 25  GLUCOSE 168*  BUN 28*  CREATININE 1.75*  CALCIUM 9.7  MG 2.0   Liver Function Tests  Recent Labs  11/05/13 1726  AST 18  ALT 28  ALKPHOS 192*  BILITOT 0.3  PROT 6.2  ALBUMIN 3.2*    Cardiac Enzymes  Recent Labs  11/05/13 1726  TROPONINI <0.30     Recent Labs  11/05/13 1726  TSH 1.620    TELE  NSR  Radiology/Studies  X-ray Chest Pa And Lateral  11/05/2013   CLINICAL DATA:  Dyspnea.  EXAM: CHEST  2 VIEW  COMPARISON:  09/14/2013 radiographs.  CT 08/21/2013.  FINDINGS: There are stable low lung volumes with stable mild prominence of the bronchovascular markings. No confluent airspace opacity, edema or significant pleural effusion is seen. The heart size and mediastinal contours are stable. Advanced glenohumeral degenerative changes are present on the right.  IMPRESSION: Stable chest.  No acute cardiopulmonary process.      2D ECHO 11/05/13 -- Study Conclusions Left ventricle: The cavity size was normal. Systolic function was vigorous. The estimated ejection fraction was in the range of 65% to 70%. Wall motion was normal; there were no regional wall motion abnormalities. Left ventricular diastolic function parameters were normal. -- Impressions: Several large echo free structures in the left lobe of the liver have the appearance of cysts. The largest measures 6.5 cm in diameter. 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 It was recommended that he be admitted for further work up and set him him for SNF placement.   Dyspnea on exertion - This has been progressive, O2 sats at home in the 70s at times per family.   - Does not appear to be volume overloaded.  - Negative troponin - Normal TSH: 1.620 - Pro BNP: 88.3 - CXR yesterday 11/05/13 shows stable chest.  - Echo showed normal systolic function EF 66-06%, no wall motion abnormalities and normal diastolic function.  ECHO did note several large echo free structures in the left lobe of liver. See report above for details   Leukocytosis  - consider UA - CXR negative for infectious process  Weakness and failure to thrive - This is the major concern for the family, he basically can't care for himself anymore.  - recurrent falls - OT consult - PT consult: recommending SNF in acute care setting to maximize independence and reduce risk of falls in ST-SNF prior to discharge home - SW consult for short-term placement  AKI- creatinine: 1.75 up from baseline of ~ 1. BMET pending today.   Chronic anticoagulation, on coumadin for DVT  - Pharmacy consulted.   - Coumadin held yesterday and PT-INR within goal range at 2.61.  - No bleeding reported - Goal INR 2-3.  Dementia  - History of frontal lobe dementia  - continue on home meds  HTN (hypertension)  - Labile at home, some orthostasis per family. Have been soft but stable.   DVT, femoral, chronic  Recent venous doppler shows no change.  CTA in April 2015 negative for PE   CAD- mild by cath 2012  - Low risk Myoview May 2015  - Troponin neg x1. No  chest pain  Type 2 diabetes mellitus  - pharmacy placed on SSI  Elevated Alk phos- 192 yesterday  Signed, Anson Crofts PA-S  Signed, Perry Mount PA-C  Pager [phone]  Echo Study Conclusions  - Left ventricle: The cavity size was normal. Systolic function was vigorous. The estimated ejection fraction was in the range of 65% to 70%. Wall motion was normal; there were no regional wall motion abnormalities. Left ventricular diastolic function parameters were normal.  Impressions:  - Several large echo  free structures in the left lobe of the liver have the appearance of cysts. The largest measures 6.5 cm in diameter.   Patient seen and examined. Agree with assessment and plan. Major complaint is debility and inability to stand and walk. Does not appear volume overloaded. Need to monitor orthostatics. Vigorous LV fxn. May need support stockings. Will decrease furosemide.   Troy Sine, MD, [facility] 11/06/2013 7:14 PM

## 2013-11-07 ENCOUNTER — Telehealth: Payer: Self-pay | Admitting: Cardiology

## 2013-11-07 DIAGNOSIS — Z7901 Long term (current) use of anticoagulants: Secondary | ICD-10-CM

## 2013-11-07 LAB — CBC
HEMATOCRIT: 40.7 % (ref 39.0–52.0)
Hemoglobin: 13.2 g/dL (ref 13.0–17.0)
MCH: 27.7 pg (ref 26.0–34.0)
MCHC: 32.4 g/dL (ref 30.0–36.0)
MCV: 85.3 fL (ref 78.0–100.0)
Platelets: 168 10*3/uL (ref 150–400)
RBC: 4.77 MIL/uL (ref 4.22–5.81)
RDW: 14.8 % (ref 11.5–15.5)
WBC: 13.4 10*3/uL — ABNORMAL HIGH (ref 4.0–10.5)

## 2013-11-07 LAB — BASIC METABOLIC PANEL
ANION GAP: 14 (ref 5–15)
BUN: 23 mg/dL (ref 6–23)
CALCIUM: 9 mg/dL (ref 8.4–10.5)
CO2: 27 mEq/L (ref 19–32)
Chloride: 103 mEq/L (ref 96–112)
Creatinine, Ser: 1.14 mg/dL (ref 0.50–1.35)
GFR calc Af Amer: 75 mL/min — ABNORMAL LOW (ref >90)
GFR calc non Af Amer: 65 mL/min — ABNORMAL LOW (ref >90)
Glucose, Bld: 171 mg/dL — ABNORMAL HIGH (ref 70–99)
Potassium: 4.2 mEq/L (ref 3.7–5.3)
Sodium: 144 mEq/L (ref 137–147)

## 2013-11-07 LAB — GLUCOSE, CAPILLARY
GLUCOSE-CAPILLARY: 134 mg/dL — AB (ref 70–99)
GLUCOSE-CAPILLARY: 158 mg/dL — AB (ref 70–99)
GLUCOSE-CAPILLARY: 163 mg/dL — AB (ref 70–99)
Glucose-Capillary: 154 mg/dL — ABNORMAL HIGH (ref 70–99)

## 2013-11-07 LAB — PROTIME-INR
INR: 1.73 — AB (ref 0.00–1.49)
PROTHROMBIN TIME: 20.3 s — AB (ref 11.6–15.2)

## 2013-11-07 MED ORDER — WARFARIN SODIUM 7.5 MG PO TABS
7.5000 mg | ORAL_TABLET | Freq: Once | ORAL | Status: AC
  Administered 2013-11-07: 7.5 mg via ORAL
  Filled 2013-11-07: qty 1

## 2013-11-07 MED ORDER — METFORMIN HCL ER 500 MG PO TB24
1000.0000 mg | ORAL_TABLET | Freq: Every day | ORAL | Status: DC
  Administered 2013-11-07 – 2013-11-09 (×3): 1000 mg via ORAL
  Filled 2013-11-07 (×4): qty 2

## 2013-11-07 NOTE — Progress Notes (Signed)
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 It was recommended that he be admitted for further work up and set him up for SNF placement.   Subjective: Denies CP/SOB. His main complaint now is recurrent falling. He states his legs just "give out" on him. He denies syncope/ near syncope.   Objective: Vital signs in last 24 hours: Temp:  [97.5 F (36.4 C)-98.2 F (36.8 C)] 97.7 F (36.5 C) (07/15 0617) Pulse Rate:  [78-106] 78 (07/15 0617) Resp:  [18-22] 22 (07/15 0617) BP: (111-138)/(58-98) 113/58 mmHg (07/15 0617) SpO2:  [94 %-98 %] 94 % (07/15 0617) Weight:  [214 lb 8.1 oz (97.3 kg)] 214 lb 8.1 oz (97.3 kg) (07/15 0617) Last BM Date: 11/06/13  Intake/Output from previous day: 07/14 0701 - 07/15 0700 In: 720 [P.O.:720] Out: 550 [Urine:550] Intake/Output this shift:    Medications Current Facility-Administered Medications  Medication Dose Route Frequency Provider Last Rate Last Dose  . acetaminophen (TYLENOL) tablet 650 mg  650 mg Oral Q4H PRN Erlene Quan, PA-C      . ALPRAZolam Duanne Moron) tablet 1 mg  1 mg Oral BID Erlene Quan, PA-C   1 mg at 11/06/13 2139  . aspirin EC tablet 81 mg  81 mg Oral Daily Erlene Quan, PA-C   81 mg at 11/06/13 7341  . atorvastatin (LIPITOR) tablet 80 mg  80 mg Oral [address] [place], PA-C   80 mg at 11/06/13 1732  . buPROPion (WELLBUTRIN XL) 24 hr tablet 300 mg  300 mg Oral Daily Erlene Quan, PA-C   300 mg at 11/06/13 0951  . dexmethylphenidate (FOCALIN XR) 24 hr capsule 40 mg  40 mg Oral Daily Troy Sine, MD      . escitalopram (LEXAPRO) tablet 20 mg   20 mg Oral BID Erlene Quan, PA-C   20 mg at 11/06/13 2139  . finasteride (PROSCAR) tablet 5 mg  5 mg Oral Daily Doreene Burke [place], PA-C   5 mg at 11/06/13 9379  . furosemide (LASIX) tablet 20 mg  20 mg Oral Daily Troy Sine, MD      . hydrOXYzine (VISTARIL) capsule 100 mg  100 mg Oral QHS Erlene Quan, PA-C   100 mg at 11/06/13 2139  . insulin aspart (novoLOG) injection 0-5 Units  0-5 Units Subcutaneous QHS Luke K Kilroy, PA-C      . insulin aspart (novoLOG) injection 0-9 Units  0-9 Units Subcutaneous TID WC Erlene Quan, PA-C   1 Units at 11/07/13 0240  . lamoTRIgine (LAMICTAL) tablet 200 mg  200 mg Oral Daily Erlene Quan, PA-C   200 mg at 11/06/13 9735  . lisinopril (PRINIVIL,ZESTRIL) tablet 10 mg  10 mg Oral Daily Erlene Quan, PA-C   10 mg at 11/06/13 3299  . nitroGLYCERIN (NITROSTAT) SL tablet 0.4 mg  0.4 mg Sublingual Q5 Min x 3 PRN Doreene Burke [place], PA-C      . ondansetron [facility]) injection 4 mg  4 mg Intravenous Q6H PRN Luke K Kilroy, PA-C      . rOPINIRole (REQUIP) tablet 1 mg  1 mg Oral QHS  Erlene Quan, PA-C   1 mg at 11/06/13 2139  . tamsulosin (FLOMAX) capsule 0.4 mg  0.4 mg Oral Daily Erlene Quan, PA-C   0.4 mg at 11/06/13 0951  . traZODone (DESYREL) tablet 200 mg  200 mg Oral QHS Doreene Burke [place], PA-C   200 mg at 11/06/13 2139  . Warfarin - Pharmacist Dosing Inpatient   Does not apply q1800 Lavenia Atlas, [facility]        PE: General appearance: alert, cooperative and no distress Lungs: clear to auscultation bilaterally Heart: regular rate and rhythm, S1, S2 normal, no murmur, click, rub or gallop Extremities: no LEE Pulses: 2+ and symmetric Skin: warm and dry Neurologic: Motor: grade 4 quadriceps femoris bilaterally  Lab Results:   Recent Labs  11/05/13 1726 11/06/13 1400 11/07/13 0525  WBC 12.8* 14.8* 13.4*  HGB 14.0 13.8 13.2  HCT 43.3 41.7 40.7  PLT 168 166 168   BMET  Recent Labs  11/05/13 1726 11/06/13 1400 11/07/13 0525  NA 141 142 144  K 4.2  4.1 4.2  CL 98 98 103  CO2 25 23 27   GLUCOSE 168* 160* 171*  BUN 28* 23 23  CREATININE 1.75* 1.34 1.14  CALCIUM 9.7 9.3 9.0   PT/INR  Recent Labs  11/05/13 2055 11/06/13 0411 11/07/13 0525  LABPROT 29.7* 27.9* 20.3*  INR 2.82* 2.61* 1.73*     Assessment/Plan  Active Problems:   Dyspnea  1. DOE: ? Etiology. W/u thus far has been unremarkable. Pro BNP WNL at 88.3. Echo with normal systolic function. EF 65-70%. No significant valvular abnormalities noted. No pericardial effusion. CXR on arrival was unremarkable. He is not anemic.   2. Failure to thrive: per family, he is unable to care for himself. H/o recurrent falls (states his legs just given out on him). Denies LOC with these falls. No presyncope. PT recommends SNF for temporary rehab. Given that he is on chronic warfarin therapy, agree that he will need assistance to increase strength and continue gait training to minimize risk for falls. Will place SW consult for help with placement.  3. HTN: controlled  4. DVT, femoral, chronic :Recent venous doppler shows no change. CTA in April 2015 negative for PE   5. Chronic anticoagulation, on coumadin for DVT: INR is now subtherapeutic at 1.73. Goal is 2-3. Pharmacy is on board dosing warfarin.       LOS: 2 days    Brittainy M. Ladoris Gene 11/07/2013 9:29 AM   Patient seen and examined. Agree with assessment and plan. No edema. Pt states that he has neuro w/u in past for leg weakness. Lasix dose was reduced yesterday; Cr better today. Check orthostatic vitals. SW to help with SNF placement.   Troy Sine, MD, [facility] 11/07/2013 10:50 AM

## 2013-11-07 NOTE — Telephone Encounter (Signed)
Gary Underwood is wanting to know if you could do a letter for her that shows that father is not rational enough to make decisions and needs help . She has POA already . Please call her at [phone] if you have any questions    Thanks

## 2013-11-07 NOTE — Telephone Encounter (Signed)
No I won't. That is not something I can do. She will need a psychiatric consult to say that the pt is not competent to make his own decisions.   Kerin Ransom PA-C 11/07/2013 2:16 PM

## 2013-11-07 NOTE — Progress Notes (Signed)
Rehab Admissions Coordinator Note:  Patient was screened by Kynzlee Hucker L for appropriateness for an Inpatient Acute Rehab Consult.  At this time, we are recommending Inpatient Rehab consult.  Keyshawn Hellwig, PT Rehabilitation Admissions Coordinator [phone]  

## 2013-11-07 NOTE — Addendum Note (Signed)
Addended by: Vear Clock on: 11/07/2013 02:26 PM   Modules accepted: Orders

## 2013-11-07 NOTE — Progress Notes (Signed)
ANTICOAGULATION CONSULT NOTE - FOLLOW UP   Pharmacy Consult:  Coumadin  Indication: History of DVT   No Known Allergies  Patient Measurements: Height: 5\' 10"  (177.8 cm) Weight: 214 lb 8.1 oz (97.3 kg) IBW/kg (Calculated) : 73  Vital Signs: Temp: 97.5 F (36.4 C) (07/15 0928) Temp src: Oral (07/15 0928) BP: 122/57 mmHg (07/15 0928) Pulse Rate: 88 (07/15 0928)  Labs:  Recent Labs  11/05/13 1726 11/05/13 2055 11/06/13 0411 11/06/13 1400 11/07/13 0525  HGB 14.0  --   --  13.8 13.2  HCT 43.3  --   --  41.7 40.7  PLT 168  --   --  166 168  APTT  --  35  --   --   --   LABPROT  --  29.7* 27.9*  --  20.3*  INR  --  2.82* 2.61*  --  1.73*  CREATININE 1.75*  --   --  1.34 1.14  TROPONINI <0.30  --   --   --   --     Estimated Creatinine Clearance: 73.6 ml/min (by C-G formula based on Cr of 1.14).   Assessment: 34 YOM who was a direct admit for increasing dyspnea and failure to thrive. Pharmacy consulted to resume coumadin dosing for chronic DVT.  INR decreased to subtherapeutic level post Coumadin held 7/13. No bleeding reported.  Goal of Therapy:  INR 2-3 Monitor platelets by anticoagulation protocol: Yes   Plan:  - Coumadin 7.5mg  PO today - Daily PT / INR - Monitor for s/sx of bleeding - SCr back down today, restart Metformin - Monitor CBGs  Thank you for allowing pharmacy to be part of this patients care team  Zaniah Titterington M. Lalania Haseman, Pharm.D Clinical Pharmacy Resident Pager: [phone] 11/07/2013 .11:07 AM

## 2013-11-07 NOTE — Progress Notes (Signed)
The patient did not receive any PRN medications overnight.  He refused Tylenol for his mild right leg pain.  He still has dyspnea on exertion and is unsteady when ambulating.  The bed alarm was kept on overnight.  His VS are stable.

## 2013-11-07 NOTE — Clinical Social Work Psychosocial (Signed)
    Clinical Social Work Department BRIEF PSYCHOSOCIAL ASSESSMENT 11/07/2013  Patient:  Gary Underwood, Gary Underwood     Account Number:  [id number]     Admit date:  11/05/2013  Clinical Social Worker:  Iona Coach  Date/Time:  11/07/2013 03:56 PM  Referred by:  Physician  Date Referred:  11/07/2013 Referred for  SNF Placement   Other Referral:   Interview type:  Other - See comment Other interview type:   Patient and spoke to patient's daughter by phone    PSYCHOSOCIAL DATA Living Status:  WITH ADULT CHILDREN Admitted from facility:   Level of care:   Primary support name:  Pierce Crane  [phone] Primary support relationship to patient:  CHILD, ADULT Degree of support available:   Strong support    CURRENT CONCERNS Current Concerns  Post-Acute Placement  Other - See comment   Other Concerns:   Daughter wants SNF placement- states she cannot manage patient at home any longer- her health is bad. Patient refuses.    SOCIAL WORK ASSESSMENT / PLAN CSW referred to assist this 68 year old male with SNF placement per recommendation of Physical Therapy and request of his daughter Jeral Fruit. Daughter states that her health is rapidly deteriorating and she is not able to manage her father's care at this time. Patient currently has a studio apartment attached to his daughter's house. He states that he feels he is managing well and currently refuses SNF. CSW, along with Physical Therapist- attempted to convince patient of need and benefits of short term SNF. His daughter wanted patient to be "made" to go and stated that she was prepared to get guardianship if necessary to "make him go". CSW discussed this process and what guardianship means. She stated that she will continue to process as she stated that his mentation contiues to go downhill. Disucssed bed search process and at this time- patient refuses to agree to SNF.  Daughter stated that she was going to talk to patient this evening in attempt  to make him understand that she cannot take him home right now. She will follow up with CSW in the morning.  Fl2 will be initiated and will send out bed search if he agrees.   Assessment/plan status:  Psychosocial Support/Ongoing Assessment of Needs Other assessment/ plan:   Information/referral to community resources:   SNF bed list offered and deferred at this time by patient    PATIENT'S/FAMILY'S RESPONSE TO PLAN OF CARE: As above- patient is currently refusing SNF placement. Wants to go home and take care of his dogs.  He acknowledges his daughter wants him to go to a SNF but he does not wish to go and does not feel that he needs to go. Daughter was very frustrated with patient's refusal to agree to SNF and states that she may have to "refuse" to let him come home as she simply cannot manage his care at thsi time. CSW will follow up with patient and daughter in the a.m.

## 2013-11-07 NOTE — Progress Notes (Signed)
Physical Therapy Treatment Patient Details Name: Gary Underwood MRN: [id number] DOB: 1945/07/07 Today's Date: 11/07/2013    History of Present Illness Patient is a 68 y/o male admitted with increased dyspnea and failure to thrive at home. 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     PT Comments    Pt progressing towards physical therapy goals. Unsteady during gait training, with refusal to use RW for support. Pt very out of breath at end of gait training, and tolerance for functional activity is low. Discussed possibility of CIR at d/c to advance to a modified independent level for safe return home. Pt more agreeable to this option vs. SNF. Will continue to follow.   Follow Up Recommendations  CIR;Supervision/Assistance - 24 hour     Equipment Recommendations  None recommended by PT    Recommendations for Other Services Rehab consult     Precautions / Restrictions Precautions Precautions: Fall Restrictions Weight Bearing Restrictions: No    Mobility  Bed Mobility Overal bed mobility: Needs Assistance Bed Mobility: Supine to Sit     Supine to sit: Supervision     General bed mobility comments: use of handrail for support, increased time. Supervision for safety.  Transfers Overall transfer level: Needs assistance Equipment used: Rolling walker (2 wheeled) Transfers: Sit to/from Stand Sit to Stand: Min guard         General transfer comment: VC for technique, hand placement and anterior translation. Able to stand with use of UE support. Demonstrated increased difficulty with standing without use of UE's during Berg.   Ambulation/Gait Ambulation/Gait assistance: Min guard;Min assist Ambulation Distance (Feet): 200 Feet Assistive device: None Gait Pattern/deviations: Trunk flexed;Drifts  right/left;Decreased stance time - right;Decreased step length - left Gait velocity: Decreased Gait velocity interpretation: Below normal speed for age/gender General Gait Details: Pt unsteady throughout gait training but refused use of AD. Occasional min assist required to prevent fall. VC's for general safety awareness and to slow down to avoid losses of balance.    Stairs            Wheelchair Mobility    Modified Rankin (Stroke Patients Only)       Balance Overall balance assessment: Needs assistance Sitting-balance support: Feet supported;No upper extremity supported Sitting balance-Leahy Scale: Good     Standing balance support: No upper extremity supported Standing balance-Leahy Scale: Poor                      Cognition Arousal/Alertness: Awake/alert Behavior During Therapy: WFL for tasks assessed/performed Overall Cognitive Status: No family/caregiver present to determine baseline cognitive functioning                      Exercises      General Comments        Pertinent Vitals/Pain Vitals stable throughout session. DOE with gait training, and during this time O2 sats at 97% on RA.     Home Living                      Prior Function            PT Goals (current goals can now be found in the care plan section) Acute Rehab PT Goals Patient Stated Goal: to go home PT Goal Formulation: With patient Time For Goal Achievement: 11/20/13 Potential to  Achieve Goals: Good Progress towards PT goals: Progressing toward goals    Frequency  Min 2X/week    PT Plan Discharge plan needs to be updated    Co-evaluation             End of Session Equipment Utilized During Treatment: Gait belt Activity Tolerance: Patient tolerated treatment well Patient left: in chair;with call bell/phone within reach     Time: 1437-1511 PT Time Calculation (min): 34 min  Charges:  $Gait Training: 8-22 mins $Neuromuscular Re-education:  8-22 mins                    G Codes:      Jolyn Lent Nov 24, 2013, 3:24 PM  Jolyn Lent, PT, DPT Acute Rehabilitation Services Pager: [phone]

## 2013-11-08 LAB — BASIC METABOLIC PANEL
ANION GAP: 17 — AB (ref 5–15)
BUN: 25 mg/dL — AB (ref 6–23)
CO2: 24 meq/L (ref 19–32)
CREATININE: 1.08 mg/dL (ref 0.50–1.35)
Calcium: 8.9 mg/dL (ref 8.4–10.5)
Chloride: 104 mEq/L (ref 96–112)
GFR calc non Af Amer: 69 mL/min — ABNORMAL LOW (ref >90)
GFR, EST AFRICAN AMERICAN: 80 mL/min — AB (ref >90)
Glucose, Bld: 161 mg/dL — ABNORMAL HIGH (ref 70–99)
POTASSIUM: 4.2 meq/L (ref 3.7–5.3)
Sodium: 145 mEq/L (ref 137–147)

## 2013-11-08 LAB — CBC
HCT: 40.5 % (ref 39.0–52.0)
Hemoglobin: 13.1 g/dL (ref 13.0–17.0)
MCH: 27.9 pg (ref 26.0–34.0)
MCHC: 32.3 g/dL (ref 30.0–36.0)
MCV: 86.4 fL (ref 78.0–100.0)
Platelets: 168 10*3/uL (ref 150–400)
RBC: 4.69 MIL/uL (ref 4.22–5.81)
RDW: 15.1 % (ref 11.5–15.5)
WBC: 12.4 10*3/uL — ABNORMAL HIGH (ref 4.0–10.5)

## 2013-11-08 LAB — GLUCOSE, CAPILLARY
GLUCOSE-CAPILLARY: 162 mg/dL — AB (ref 70–99)
Glucose-Capillary: 98 mg/dL (ref 70–99)
Glucose-Capillary: 85 mg/dL (ref 70–99)
Glucose-Capillary: 131 mg/dL — ABNORMAL HIGH (ref 70–99)

## 2013-11-08 LAB — PROTIME-INR
INR: 1.77 — ABNORMAL HIGH (ref 0.00–1.49)
PROTHROMBIN TIME: 20.6 s — AB (ref 11.6–15.2)

## 2013-11-08 MED ORDER — WARFARIN SODIUM 7.5 MG PO TABS
7.5000 mg | ORAL_TABLET | Freq: Once | ORAL | Status: AC
  Administered 2013-11-08: 7.5 mg via ORAL
  Filled 2013-11-08: qty 1

## 2013-11-08 MED ORDER — METOPROLOL TARTRATE 12.5 MG HALF TABLET
12.5000 mg | ORAL_TABLET | Freq: Two times a day (BID) | ORAL | Status: DC
  Administered 2013-11-08 – 2013-11-09 (×3): 12.5 mg via ORAL
  Filled 2013-11-08 (×4): qty 1

## 2013-11-08 NOTE — Progress Notes (Signed)
After speaking with pt, his daughter, and SIL, pt now agreeable to NHP.  Bed search initiated.  Await offers.

## 2013-11-08 NOTE — Progress Notes (Addendum)
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 It was recommended that he be admitted for further work up and set him up for SNF placement.   Subjective: No complaints. He feels that he is walking much better. Denies CP/SOB.   Objective: Vital signs in last 24 hours: Temp:  [97.1 F (36.2 C)-97.6 F (36.4 C)] 97.2 F (36.2 C) (07/16 0542) Pulse Rate:  [79-105] 79 (07/16 0542) Resp:  [20-22] 20 (07/16 0542) BP: (131-142)/(64-76) 142/67 mmHg (07/16 1013) SpO2:  [96 %-98 %] 96 % (07/16 0542) Weight:  [213 lb 1.6 oz (96.662 kg)] 213 lb 1.6 oz (96.662 kg) (07/16 0542) Last BM Date: 11/07/13  Intake/Output from previous day: 07/15 0701 - 07/16 0700 In: 960 [P.O.:960] Out: 550 [Urine:550] Intake/Output this shift: Total I/O In: 120 [P.O.:120] Out: -   Medications Current Facility-Administered Medications  Medication Dose Route Frequency Provider Last Rate Last Dose  . acetaminophen (TYLENOL) tablet 650 mg  650 mg Oral Q4H PRN Erlene Quan, PA-C      . ALPRAZolam Duanne Moron) tablet 1 mg  1 mg Oral BID Doreene Burke [place], PA-C   1 mg at 11/08/13 1010  . aspirin EC tablet 81 mg  81 mg Oral Daily Erlene Quan, PA-C   81 mg at 11/08/13 1011  . atorvastatin (LIPITOR) tablet 80 mg  80 mg Oral [address] Kilroy, PA-C   80 mg at 11/07/13 1736  . buPROPion (WELLBUTRIN XL) 24 hr tablet 300 mg  300 mg Oral Daily Erlene Quan, PA-C   300 mg at 11/08/13 1011  . dexmethylphenidate (FOCALIN XR) 24 hr capsule 40 mg  40 mg Oral Daily Troy Sine, MD   40 mg at 11/08/13 1029  . escitalopram (LEXAPRO) tablet 20 mg   20 mg Oral BID Doreene Burke [place], PA-C   20 mg at 11/08/13 1010  . finasteride (PROSCAR) tablet 5 mg  5 mg Oral Daily Doreene Burke [place], PA-C   5 mg at 11/08/13 1010  . furosemide (LASIX) tablet 20 mg  20 mg Oral Daily Troy Sine, MD   20 mg at 11/08/13 1012  . hydrOXYzine (VISTARIL) capsule 100 mg  100 mg Oral QHS Erlene Quan, PA-C   100 mg at 11/07/13 2210  . insulin aspart (novoLOG) injection 0-5 Units  0-5 Units Subcutaneous QHS Luke K Kilroy, PA-C      . insulin aspart (novoLOG) injection 0-9 Units  0-9 Units Subcutaneous TID WC Erlene Quan, PA-C   2 Units at 11/08/13 [phone]  . lamoTRIgine (LAMICTAL) tablet 200 mg  200 mg Oral Daily Erlene Quan, PA-C   200 mg at 11/08/13 1013  . lisinopril (PRINIVIL,ZESTRIL) tablet 10 mg  10 mg Oral Daily Erlene Quan, PA-C   10 mg at 11/08/13 1013  . metFORMIN (GLUCOPHAGE-XR) 24 hr tablet 1,000 mg  1,000 mg Oral Q breakfast Thuy Dien Dang, RPH   1,000 mg at 11/08/13 0631  . nitroGLYCERIN (NITROSTAT) SL tablet 0.4 mg  0.4 mg Sublingual Q5 Min x 3 PRN Luke K Kilroy, PA-C      . ondansetron [facility]) injection 4  mg  4 mg Intravenous Q6H PRN Erlene Quan, PA-C      . rOPINIRole (REQUIP) tablet 1 mg  1 mg Oral QHS Doreene Burke [place], PA-C   1 mg at 11/07/13 2210  . tamsulosin (FLOMAX) capsule 0.4 mg  0.4 mg Oral Daily Erlene Quan, PA-C   0.4 mg at 11/08/13 1014  . traZODone (DESYREL) tablet 200 mg  200 mg Oral QHS Doreene Burke [place], PA-C   200 mg at 11/07/13 2210  . warfarin (COUMADIN) tablet 7.5 mg  7.5 mg Oral ONCE-1800 [place], [place]      . Warfarin - Pharmacist Dosing Inpatient   Does not apply q1800 Lavenia Atlas, [facility]        PE: General appearance: alert, cooperative and no distress Lungs: clear to auscultation bilaterally Heart: regular rate and rhythm, S1, S2 normal, no murmur, click, rub or gallop Extremities: no LEE Pulses: 2+ and symmetric Skin: warm and dry Neurologic: Motor: grade 4 quadriceps femoris bilaterally  Lab Results:   Recent  Labs  11/06/13 1400 11/07/13 0525 11/08/13 0431  WBC 14.8* 13.4* 12.4*  HGB 13.8 13.2 13.1  HCT 41.7 40.7 40.5  PLT 166 168 168   BMET  Recent Labs  11/06/13 1400 11/07/13 0525 11/08/13 0431  NA 142 144 145  K 4.1 4.2 4.2  CL 98 103 104  CO2 23 27 24   GLUCOSE 160* 171* 161*  BUN 23 23 25*  CREATININE 1.34 1.14 1.08  CALCIUM 9.3 9.0 8.9   PT/INR  Recent Labs  11/06/13 0411 11/07/13 0525 11/08/13 0431  LABPROT 27.9* 20.3* 20.6*  INR 2.61* 1.73* 1.77*     Assessment/Plan  Active Problems:   Dyspnea  1. DOE: ? Etiology. W/u thus far has been unremarkable. Pro BNP WNL at 88.3. Echo with normal systolic function. EF 65-70%. No significant valvular abnormalities noted. No pericardial effusion. CXR on arrival was unremarkable. He is not anemic.   2. Failure to thrive: per family, he is also unable to care for himself. H/o recurrent falls (states his legs just given out on him). Denies LOC with these falls. No presyncope. PT recommends SNF for temporary rehab. Given that he is on chronic warfarin therapy, agree that he will need assistance to increase strength and continue gait training to minimize risk for falls. SW is now on board to help with placement.   3. HTN: moderately elevated in the 235T systolic, however will not make medication adjustments until orthostatics are checked. RN made aware today and will check orthostatics promptly to rule out orthostatic hypotension as the result of his falls. His lasix was decreased several days ago.  4. DVT, femoral, chronic: Recent venous doppler shows no change. CTA in April 2015 negative for PE   5. Chronic anticoagulation, on coumadin for DVT: INR is remains subtherapeutic at 1.77. Goal is 2-3. Pharmacy is on board dosing warfarin.       LOS: 3 days    Brittainy M. Ladoris Gene 11/08/2013 10:56 AM   Patient seen and examined. Agree with assessment and plan. Orthostatics to be done at same time. No significant drop  noted yesterday but these were not taken at same time. Just now completed: 130/61, 86 supine; 139/85,93 sitting; 151/88, 108 standing. Negative for significant orthostasis. With BP and HR will add low dose metoprolol 12.5 mg bid to regimen.   Troy Sine, MD, [facility] 11/08/2013 11:11 AM

## 2013-11-08 NOTE — Progress Notes (Signed)
CSW spoke with pt who adamantly refuses NHP at this time.  Pt would, however, consider CIR if deemed appropriate.  Pt maintains that he will be going back to his daughter's house at d/c, as he paid for the addition he lives in.  CSW will continue to follow.  FL2/pasarr complete in case pt changes mind re: SNF.

## 2013-11-08 NOTE — Progress Notes (Signed)
Clinical Social Work Department CLINICAL SOCIAL WORK PLACEMENT NOTE 11/08/2013  Patient:  Medders,Samari  Account Number:  [id number] Admit date:  11/05/2013  Clinical Social Worker:  Creta Levin, LCSW  Date/time:  11/08/2013 04:18 AM  Clinical Social Work is seeking post-discharge placement for this patient at the following level of care:   SKILLED NURSING   (*CSW will update this form in Epic as items are completed)   11/08/2013  Patient/family provided with [place] Department of Clinical Social Work's list of facilities offering this level of care within the geographic area requested by the patient (or if unable, by the patient's family).  11/08/2013  Patient/family informed of their freedom to choose among providers that offer the needed level of care, that participate in Medicare, Medicaid or managed care program needed by the patient, have an available bed and are willing to accept the patient.  11/08/2013  Patient/family informed of [facility]' ownership interest in [facility], as well as of the fact that they are under no obligation to receive care at this facility.  PASARR submitted to EDS on 11/08/2013 PASARR number received on   FL2 transmitted to all facilities in geographic area requested by pt/family on  11/08/2013 FL2 transmitted to all facilities within larger geographic area on   Patient informed that his/her managed care company has contracts with or will negotiate with  certain facilities, including the following:     Patient/family informed of bed offers received:   Patient chooses bed at  Physician recommends and patient chooses bed at    Patient to be transferred to  on   Patient to be transferred to facility by  Patient and family notified of transfer on  Name of family member notified:    The following physician request were entered in Epic:   Additional Comments:

## 2013-11-08 NOTE — Progress Notes (Signed)
ANTICOAGULATION CONSULT NOTE - FOLLOW UP   Pharmacy Consult:  Coumadin  Indication: History of DVT   No Known Allergies  Patient Measurements: Height: 5\' 10"  (177.8 cm) Weight: 213 lb 1.6 oz (96.662 kg) (b scale) IBW/kg (Calculated) : 73  Vital Signs: Temp: 97.2 F (36.2 C) (07/16 0542) Temp src: Oral (07/16 0542) BP: 131/64 mmHg (07/16 0542) Pulse Rate: 79 (07/16 0542)  Labs:  Recent Labs  11/05/13 1726  11/05/13 2055 11/06/13 0411 11/06/13 1400 11/07/13 0525 11/08/13 0431  HGB 14.0  --   --   --  13.8 13.2 13.1  HCT 43.3  --   --   --  41.7 40.7 40.5  PLT 168  --   --   --  166 168 168  APTT  --   --  35  --   --   --   --   LABPROT  --   < > 29.7* 27.9*  --  20.3* 20.6*  INR  --   < > 2.82* 2.61*  --  1.73* 1.77*  CREATININE 1.75*  --   --   --  1.34 1.14 1.08  TROPONINI <0.30  --   --   --   --   --   --   < > = values in this interval not displayed.  Estimated Creatinine Clearance: 77.4 ml/min (by C-G formula based on Cr of 1.08).    Assessment: 34 YOM who was a direct admit for increasing dyspnea and failure to thrive. Pharmacy consulted to resume Coumadin dosing for history of DVT.  INR remains sub-therapeutic but is starting to trend back up after Coumadin dose was held on 11/05/13.  No bleeding reported.   Goal of Therapy:  INR 2-3 Monitor platelets by anticoagulation protocol: Yes    Plan:  - Repeat Coumadin 7.5mg  PO today - Daily PT / INR - Monitor for s/sx of bleeding    Tynetta Bachmann D. Mina Marble, PharmD, BCPS Pager:  [phone] 11/08/2013, 9:30 AM

## 2013-11-08 NOTE — Care Management Note (Signed)
    Page 1 of 1   11/08/2013     10:01:07 AM CARE MANAGEMENT NOTE 11/08/2013  Patient:  Gary Underwood,Gary Underwood   Account Number:  [id number]  Date Initiated:  11/08/2013  Documentation initiated by:  Fuller Mandril  Subjective/Objective Assessment:   68 yo male has had increasing dyspnea and failure to thrive at home.//Home alone with daughter nearby.     Action/Plan:   Echo & CXR to assess hypoxia/dyspnea - diuresis is likely indicated.  PT/OT evaluation & SW consult, as he will need at least short term placement. //SNF vs Home w HH vs CIR   Anticipated DC Date:  11/09/2013   Anticipated DC Plan:  SKILLED NURSING FACILITY  In-house referral  Clinical Social Worker      DC Planning Services  CM consult      Choice offered to / List presented to:  C-1 Patient           Status of service:  In process, will continue to follow Medicare Important Message given?  YES (If response is "NO", the following Medicare IM given date fields will be blank) Date Medicare IM given:  11/08/2013 Medicare IM given by:  [facility] Date Additional Medicare IM given:   Additional Medicare IM given by:    Discharge Disposition:    Per UR Regulation:  Reviewed for med. necessity/level of care/duration of stay  If discussed at [place] of Stay Meetings, dates discussed:    Comments:

## 2013-11-08 NOTE — Progress Notes (Addendum)
Patient has now agreed to SNF; CSW spoke to daughter Jeral Fruit via phone this evening.  SNF search in place and offers will be provided to patient and daughter in the morning.  MD: Please plan SNF placement when medically stable.  Lorie Phenix. Pauline Good, [place]

## 2013-11-09 ENCOUNTER — Encounter (HOSPITAL_COMMUNITY): Payer: Self-pay | Admitting: Physician Assistant

## 2013-11-09 DIAGNOSIS — R627 Adult failure to thrive: Secondary | ICD-10-CM | POA: Diagnosis present

## 2013-11-09 DIAGNOSIS — C61 Malignant neoplasm of prostate: Secondary | ICD-10-CM | POA: Diagnosis present

## 2013-11-09 LAB — CBC
HEMATOCRIT: 41 % (ref 39.0–52.0)
Hemoglobin: 13.1 g/dL (ref 13.0–17.0)
MCH: 27.8 pg (ref 26.0–34.0)
MCHC: 32 g/dL (ref 30.0–36.0)
MCV: 87 fL (ref 78.0–100.0)
PLATELETS: 147 10*3/uL — AB (ref 150–400)
RBC: 4.71 MIL/uL (ref 4.22–5.81)
RDW: 14.8 % (ref 11.5–15.5)
WBC: 9.4 10*3/uL (ref 4.0–10.5)

## 2013-11-09 LAB — BASIC METABOLIC PANEL
Anion gap: 15 (ref 5–15)
BUN: 25 mg/dL — ABNORMAL HIGH (ref 6–23)
CALCIUM: 8.8 mg/dL (ref 8.4–10.5)
CO2: 26 meq/L (ref 19–32)
CREATININE: 1.07 mg/dL (ref 0.50–1.35)
Chloride: 102 mEq/L (ref 96–112)
GFR calc Af Amer: 81 mL/min — ABNORMAL LOW (ref >90)
GFR, EST NON AFRICAN AMERICAN: 70 mL/min — AB (ref >90)
GLUCOSE: 149 mg/dL — AB (ref 70–99)
Potassium: 4.1 mEq/L (ref 3.7–5.3)
Sodium: 143 mEq/L (ref 137–147)

## 2013-11-09 LAB — PROTIME-INR
INR: 2.37 — ABNORMAL HIGH (ref 0.00–1.49)
Prothrombin Time: 25.9 seconds — ABNORMAL HIGH (ref 11.6–15.2)

## 2013-11-09 LAB — GLUCOSE, CAPILLARY
GLUCOSE-CAPILLARY: 130 mg/dL — AB (ref 70–99)
GLUCOSE-CAPILLARY: 125 mg/dL — AB (ref 70–99)

## 2013-11-09 MED ORDER — ASPIRIN 81 MG PO TBEC: 81.0000 mg | DELAYED_RELEASE_TABLET | Freq: Every day | ORAL | Status: AC

## 2013-11-09 MED ORDER — METOPROLOL TARTRATE 12.5 MG HALF TABLET: 12.5000 mg | ORAL_TABLET | Freq: Two times a day (BID) | ORAL | Status: AC

## 2013-11-09 MED ORDER — WARFARIN SODIUM 5 MG PO TABS
5.0000 mg | ORAL_TABLET | Freq: Once | ORAL | Status: DC
  Filled 2013-11-09: qty 1

## 2013-11-09 NOTE — Progress Notes (Signed)
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 It was recommended that he be admitted for further work up and set him up for SNF placement.   Subjective: No complaints. He feels that he is walking much better. Denies CP/SOB. Feels the same.  Objective: Vital signs in last 24 hours: Temp:  [97.5 F (36.4 C)-98 F (36.7 C)] 97.5 F (36.4 C) (07/17 0710) Pulse Rate:  [71-105] 71 (07/17 0710) Resp:  [18-20] 18 (07/17 0710) BP: (129-145)/(68-85) 137/79 mmHg (07/17 0710) SpO2:  [97 %-100 %] 97 % (07/17 0710) Weight:  [212 lb 4.9 oz (96.3 kg)] 212 lb 4.9 oz (96.3 kg) (07/17 0710) Last BM Date: 11/08/13  Intake/Output from previous day: 07/16 0701 - 07/17 0700 In: 1380 [P.O.:1380] Out: 990 [Urine:990] Intake/Output this shift: Total I/O In: 120 [P.O.:120] Out: 275 [Urine:275]  Medications Current Facility-Administered Medications  Medication Dose Route Frequency Provider Last Rate Last Dose  . acetaminophen (TYLENOL) tablet 650 mg  650 mg Oral Q4H PRN Erlene Quan, PA-C      . ALPRAZolam Duanne Moron) tablet 1 mg  1 mg Oral BID Erlene Quan, PA-C   1 mg at 11/08/13 2130  . aspirin EC tablet 81 mg  81 mg Oral Daily Erlene Quan, PA-C   81 mg at 11/08/13 1011  . atorvastatin (LIPITOR) tablet 80 mg  80 mg Oral [address] [place], PA-C   80 mg at 11/08/13 1735  . buPROPion (WELLBUTRIN XL) 24 hr tablet 300 mg  300 mg Oral Daily Erlene Quan, PA-C   300 mg at 11/08/13 1011  . dexmethylphenidate (FOCALIN XR) 24 hr capsule 40 mg  40 mg Oral Daily Troy Sine, MD   40 mg at 11/08/13 1029  . escitalopram  (LEXAPRO) tablet 20 mg  20 mg Oral BID Erlene Quan, PA-C   20 mg at 11/08/13 2128  . finasteride (PROSCAR) tablet 5 mg  5 mg Oral Daily Doreene Burke [place], PA-C   5 mg at 11/08/13 1010  . furosemide (LASIX) tablet 20 mg  20 mg Oral Daily Troy Sine, MD   20 mg at 11/08/13 1012  . hydrOXYzine (VISTARIL) capsule 100 mg  100 mg Oral QHS Erlene Quan, PA-C   100 mg at 11/08/13 2130  . insulin aspart (novoLOG) injection 0-5 Units  0-5 Units Subcutaneous QHS Luke K Kilroy, PA-C      . insulin aspart (novoLOG) injection 0-9 Units  0-9 Units Subcutaneous TID WC Erlene Quan, PA-C   1 Units at 11/09/13 0601  . lamoTRIgine (LAMICTAL) tablet 200 mg  200 mg Oral Daily Erlene Quan, PA-C   200 mg at 11/08/13 1013  . lisinopril (PRINIVIL,ZESTRIL) tablet 10 mg  10 mg Oral Daily Erlene Quan, PA-C   10 mg at 11/08/13 1013  . metFORMIN (GLUCOPHAGE-XR) 24 hr tablet 1,000 mg  1,000 mg Oral Q breakfast Thuy Dien Dang, RPH   1,000 mg at 11/09/13 0600  . metoprolol tartrate (LOPRESSOR) tablet 12.5 mg  12.5 mg Oral BID Troy Sine, MD   12.5 mg at 11/08/13 2128  . nitroGLYCERIN (NITROSTAT) SL  tablet 0.4 mg  0.4 mg Sublingual Q5 Min x 3 PRN Doreene Burke [place], PA-C      . ondansetron [facility]) injection 4 mg  4 mg Intravenous Q6H PRN Erlene Quan, PA-C      . rOPINIRole (REQUIP) tablet 1 mg  1 mg Oral QHS Doreene Burke [place], PA-C   1 mg at 11/08/13 2128  . tamsulosin (FLOMAX) capsule 0.4 mg  0.4 mg Oral Daily Erlene Quan, PA-C   0.4 mg at 11/08/13 1014  . traZODone (DESYREL) tablet 200 mg  200 mg Oral QHS Erlene Quan, PA-C   200 mg at 11/08/13 2128  . Warfarin - Pharmacist Dosing Inpatient   Does not apply q1800 Lavenia Atlas, [facility]        PE: General appearance: alert, cooperative and no distress Lungs: clear to auscultation bilaterally Heart: regular rate and rhythm, S1, S2 normal, no murmur, click, rub or gallop Extremities: no LEE Pulses: 2+ and symmetric Skin: warm and dry Neurologic: Motor: grade 4  quadriceps femoris bilaterally  Lab Results:   Recent Labs  11/07/13 0525 11/08/13 0431 11/09/13 0520  WBC 13.4* 12.4* 9.4  HGB 13.2 13.1 13.1  HCT 40.7 40.5 41.0  PLT 168 168 147*   BMET  Recent Labs  11/07/13 0525 11/08/13 0431 11/09/13 0520  NA 144 145 143  K 4.2 4.2 4.1  CL 103 104 102  CO2 27 24 26   GLUCOSE 171* 161* 149*  BUN 23 25* 25*  CREATININE 1.14 1.08 1.07  CALCIUM 9.0 8.9 8.8   PT/INR  Recent Labs  11/07/13 0525 11/08/13 0431 11/09/13 0520  LABPROT 20.3* 20.6* 25.9*  INR 1.73* 1.77* 2.37*     Assessment/Plan  Active Problems:   Dyspnea  68 yo male with PMH significant for chronic DVT on coumadin, CAD (cath 06/2010: 30% calcified proximal LAD stenosis, 40% OM1 stenosis, low risk Myoview 08/2013), HTN, HLD, DM, psoriasis w/psoriatic arthritis, 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 It was recommended that he be admitted for further work up and set him up for SNF placement.   DOE: ? Etiology. W/u thus far has been unremarkable. Pro BNP WNL at 88.3. Echo with normal systolic function. EF 65-70%. No significant valvular abnormalities noted. No pericardial effusion. CXR on arrival was unremarkable. He is not anemic.   Failure to thrive: per family, he is also unable to care for himself. H/o recurrent falls (states his legs just given out on him). Denies LOC with these falls. No presyncope. PT recommends SNF for temporary rehab. Given that he is on chronic warfarin therapy, agree that he will need assistance to increase strength and continue gait training to minimize risk for falls. SW is now on board to help with placement.  HTN: moderately elevated in the 846N systolic. Orthostatics negative for significant orthostasis yesterday: 130/61, 86 supine; 139/85,93 sitting; 151/88, 108  standing.  -- Metoprolol 12.5 mg bid added yesterday  DVT, femoral, chronic: Recent venous doppler shows no change. CTA in April 2015 negative for PE   Chronic anticoagulation, on coumadin for DVT: INR now therapeutic at 2.37. Followed by pharmacy.  Dispo- patient has now agreed to SNF.      LOS: 4 days    Perry [place], PA-C 11/09/2013 10:19 AM   Patient seen and examined. Agree with assessment and  plan. Feels stronger. No chest pain. Edema better. No orthostasis. Agree with SNF, CSW trying to arrange.   Troy Sine, MD, [facility] 11/09/2013 10:47 AM

## 2013-11-09 NOTE — Progress Notes (Signed)
At 1529 pt d/c to NH via ambulance.  IV d/c per charge nurse Toy Baker, RN.

## 2013-11-09 NOTE — Progress Notes (Signed)
ANTICOAGULATION CONSULT NOTE - FOLLOW UP   Pharmacy Consult:  Coumadin  Indication: History of DVT   No Known Allergies  Patient Measurements: Height: 5\' 10"  (177.8 cm) Weight: 212 lb 4.9 oz (96.3 kg) IBW/kg (Calculated) : 73  Vital Signs: Temp: 97.5 F (36.4 C) (07/17 0710) Temp src: Oral (07/17 0710) BP: 122/56 mmHg (07/17 1053) Pulse Rate: 81 (07/17 1053)  Labs:  Recent Labs  11/07/13 0525 11/08/13 0431 11/09/13 0520  HGB 13.2 13.1 13.1  HCT 40.7 40.5 41.0  PLT 168 168 147*  LABPROT 20.3* 20.6* 25.9*  INR 1.73* 1.77* 2.37*  CREATININE 1.14 1.08 1.07    Estimated Creatinine Clearance: 78 ml/min (by C-G formula based on Cr of 1.07).    Assessment: 23 YOM who was a direct admit for increasing dyspnea and failure to thrive. Pharmacy consulted to resume Coumadin dosing for history of DVT.  INR now therapeutic at 2.3 this am. No bleeding reported.  PTA warfarin dose - 5mg  daily except 7.5 on Monday/Wednesday  Goal of Therapy:  INR 2-3 Monitor platelets by anticoagulation protocol: Yes    Plan:  - Coumadin 5mg  PO today - Daily PT / INR - Monitor for s/sx of bleeding  Erin Hearing PharmD., BCPS Clinical Pharmacist Pager [phone] 11/09/2013 1:24 PM

## 2013-11-09 NOTE — Progress Notes (Signed)
Physical Therapy Treatment Patient Details Name: Gary Underwood MRN: [id number] DOB: 06-Jan-1946 Today's Date: 11/09/2013    History of Present Illness Patient is a 68 y/o male admitted with increased dyspnea and failure to thrive at home. 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     PT Comments    Pt progressing towards physical therapy goals. Overall balance was improved this session. Discussed potential goals of care of SNF vs. CIR. Continue to feel that CIR is an appropriate disposition at d/c as pt could be functionally challenged a good amount with PT follow-up.   Follow Up Recommendations  CIR;Supervision/Assistance - 24 hour     Equipment Recommendations  None recommended by PT    Recommendations for Other Services Rehab consult     Precautions / Restrictions Precautions Precautions: Fall Restrictions Weight Bearing Restrictions: No    Mobility  Bed Mobility Overal bed mobility: Needs Assistance Bed Mobility: Supine to Sit     Supine to sit: Supervision     General bed mobility comments: use of handrail for support, increased time. Supervision for safety.  Transfers Overall transfer level: Needs assistance Equipment used: Rolling walker (2 wheeled) Transfers: Sit to/from Stand Sit to Stand: Min guard         General transfer comment: Pt able to perform 5x sit<>stand in 18 seconds without use of UE's and without LOB.   Ambulation/Gait Ambulation/Gait assistance: Min guard Ambulation Distance (Feet): 300 Feet Assistive device: None Gait Pattern/deviations: Step-through pattern;Decreased stride length;Trunk flexed Gait velocity: Decreased Gait velocity interpretation: Below normal speed for age/gender General Gait Details: Pt continues to be slightly unsteady however no LOB  or assistance required during gait training today.    Stairs            Wheelchair Mobility    Modified Rankin (Stroke Patients Only)       Balance Overall balance assessment: Needs assistance Sitting-balance support: Feet supported Sitting balance-Leahy Scale: Good     Standing balance support: No upper extremity supported Standing balance-Leahy Scale: Poor                      Cognition Arousal/Alertness: Awake/alert Behavior During Therapy: WFL for tasks assessed/performed Overall Cognitive Status: No family/caregiver present to determine baseline cognitive functioning                 General Comments: Preoccupied during session with "figuring out whats wrong". He states "they will never figure it out but I still want to know. I just don't know."    Exercises General Exercises - Lower Extremity Long Arc Quad: 15 reps;Strengthening Heel Slides: 15 reps;Strengthening Other Exercises Other Exercises: 5x sit<>stand in 18 seconds    General Comments        Pertinent Vitals/Pain Vitals stable throughout session on RA. During/after gait training sats at 97%.     Home Living                      Prior Function            PT Goals (current goals can now be found in the care plan section) Acute Rehab PT Goals Patient Stated Goal: to go home PT Goal Formulation: With patient Time For Goal Achievement: 11/20/13 Potential to Achieve Goals: Good Progress towards PT goals: Progressing toward goals    Frequency  Min 2X/week    PT Plan Current plan remains appropriate    Co-evaluation             End of Session Equipment Utilized During Treatment: Gait belt Activity Tolerance: Patient tolerated treatment well Patient left: in chair;with call bell/phone within reach     Time: 1119-1144 PT Time Calculation (min): 25 min  Charges:  $Gait Training: 8-22 mins $Therapeutic Exercise: 8-22 mins                    G Codes:       Jolyn Lent 11-15-13, 12:57 PM  Jolyn Lent, PT, DPT Acute Rehabilitation Services Pager: [phone]

## 2013-11-09 NOTE — Discharge Summary (Signed)
Discharge Summary   Patient ID: Dyshaun Bonzo MRN: [id number], DOB/AGE: 68-May-1947 68 y.o. Admit date: 11/05/2013 D/C date:     11/09/2013  Primary Cardiologist: Dr. Sallyanne Kuster   Principal Problem:   Failure to thrive in adult Active Problems:   PSORIASIS   Dementia   HTN (hypertension)   Chronic anticoagulation, on coumadin for DVT   Bilateral leg edema   Dyspnea on exertion   CAD- mild by cath 2012   DVT, femoral, chronic   Hyperlipidemia LDL goal <70   Type 2 diabetes mellitus   Weakness   Dyspnea   Prostate cancer   Discharge Diagnosis: Failure to thrive and dyspnea of unknown etiology   HPI: Gary Underwood is a 68 y.o. 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 It was recommended that he be admitted to the hospital for further work up and set him up for SNF placement.  Hospital Course  DOE: ? Etiology. W/u thus far has been unremarkable. Pro BNP WNL at 88.3. Echo with normal systolic function. EF 65-70%. No significant valvular abnormalities noted. No pericardial effusion. CXR on arrival was unremarkable. He is not anemic.   Failure to thrive: per family, he is also unable to care for himself. He has a history of recurrent falls (states his legs just given out on him). Denies LOC with these falls. No presyncope. PT recommends SNF for temporary rehab. Given that he is on chronic warfarin therapy, agree that he will need assistance to increase strength and continue gait training to minimize risk for falls. SW is now on board to help with placement.   HTN: moderately elevated in the 952W systolic.  Orthostatics negative for significant orthostasis: 130/61, 86 supine; 139/85,93 sitting; 151/88, 108 standing. This makes orthostasis less likely the cause of his falls. -- Metoprolol 12.5 mg bid added 11/08/13 and he is tolerating this well.   DVT, femoral, chronic: Recent venous doppler shows no change. CTA in April 2015 negative for PE.  Chronic anticoagulation, on coumadin for DVT: INR was initially theraputic upon admission and then dipped down to 1.73. Pharmacy helped adjust his coumadin and he is now therapeutic at 2.37. I spoke with pharmacy who suggested that he resume his home regimen of coumadin.   Dispo- patient has now agreed to SNF and will go to [facility] .  The patient has had an uncomplicated hospital course and is recovering well. He has been seen by Dr. Claiborne Billings today and deemed ready for discharge to a SNF at  [facility]. He will follow up with Dr. Sallyanne Kuster, his cardiologist, in the next 1-2 weeks. The office will call to make the appointment. Discharge medications are listed below. Metoprolol 12.5 mg BID was added on this admission.   Discharge Vitals: Blood pressure 122/56, pulse 81, temperature 97.5 F (36.4 C), temperature source Oral, resp. rate 18, height 5\' 10"  (1.778 m), weight 212 lb 4.9 oz (96.3 kg), SpO2 97.00%.  Labs: Lab Results  Component Value Date   WBC 9.4 11/09/2013   HGB 13.1 11/09/2013   HCT 41.0 11/09/2013   MCV 87.0 11/09/2013   PLT 147*  11/09/2013    Recent Labs Lab 11/05/13 1726  11/09/13 0520  NA 141  < > 143  K 4.2  < > 4.1  CL 98  < > 102  CO2 25  < > 26  BUN 28*  < > 25*  CREATININE 1.75*  < > 1.07  CALCIUM 9.7  < > 8.8  PROT 6.2  --   --   BILITOT 0.3  --   --   ALKPHOS 192*  --   --   ALT 28  --   --   AST 18  --   --   GLUCOSE 168*  < > 149*  < > = values in this interval not displayed.   Diagnostic Studies/Procedures   X-ray Chest Pa And Lateral  11/05/2013   CLINICAL DATA:  Dyspnea.  EXAM:  CHEST  2 VIEW  COMPARISON:  09/14/2013 radiographs.  CT 08/21/2013.  FINDINGS: There are stable low lung volumes with stable mild prominence of the bronchovascular markings. No confluent airspace opacity, edema or significant pleural effusion is seen. The heart size and mediastinal contours are stable. Advanced glenohumeral degenerative changes are present on the right.  IMPRESSION: Stable chest.  No acute cardiopulmonary process.      Discharge Medications     Medication List         ALPRAZolam 1 MG tablet  Commonly known as:  XANAX  Take 1 mg by mouth 2 (two) times daily.     aspirin 81 MG EC tablet  Take 1 tablet (81 mg total) by mouth daily.     buPROPion 300 MG 24 hr tablet  Commonly known as:  WELLBUTRIN XL  Take 300 mg by mouth daily.     escitalopram 20 MG tablet  Commonly known as:  LEXAPRO  Take 20 mg by mouth 2 (two) times daily.     finasteride 5 MG tablet  Commonly known as:  PROSCAR  Take 5 mg by mouth daily.     FOCALIN XR 40 MG Cp24  Generic drug:  Dexmethylphenidate HCl  Take 40 mg by mouth daily.     furosemide 40 MG tablet  Commonly known as:  LASIX  Take 20 mg by mouth 2 (two) times daily.     hydrOXYzine 50 MG capsule  Commonly known as:  VISTARIL  Take 100 mg by mouth at bedtime.     lamoTRIgine 200 MG tablet  Commonly known as:  LAMICTAL  Take 200 mg by mouth daily.     lisinopril 10 MG tablet  Commonly known as:  PRINIVIL,ZESTRIL  Take 1 tablet (10 mg total) by mouth daily.     metFORMIN 500 MG 24 hr tablet  Commonly known as:  GLUCOPHAGE-XR  Take 1,000 mg by mouth daily.     metoprolol tartrate 12.5 mg Tabs tablet  Commonly known as:  LOPRESSOR  Take 0.5 tablets (12.5 mg total) by mouth 2 (two) times daily.     rOPINIRole 1 MG tablet  Commonly known as:  REQUIP  Take 1 mg by mouth at bedtime.     rosuvastatin 40 MG tablet  Commonly known as:  CRESTOR  Take 40 mg by mouth daily.     STELARA 90 MG/ML Soln  Generic drug:   Ustekinumab  Inject 90 mg into the skin every 3 (three) months.     tamsulosin 0.4 MG Caps capsule  Commonly known as:  FLOMAX  Take 0.4 mg by mouth daily.     traZODone 100  MG tablet  Commonly known as:  DESYREL  Take 200 mg by mouth at bedtime.     warfarin 5 MG tablet  Commonly known as:  COUMADIN  Take 5-7.5 mg by mouth daily. Take 5 mg every day, Take an additional 2.5 mg on Mondays and Wednesdays, for a total of 7.5 mg.        Disposition   The patient will be discharged in stable condition to home.  Follow-up Information   Follow up with Sanda Klein, MD. (Please make an appointment with your cardiologist in the next 1-2 weeks)    Specialty:  Cardiology   Contact information:   [address] [place] [address] [phone]         Duration of Discharge Encounter: Greater than 30 minutes including physician and PA time.  SignedVertell Limber, Eriel Dunckel PA-C 11/09/2013, 2:22 PM

## 2013-11-12 ENCOUNTER — Telehealth: Payer: Self-pay | Admitting: Cardiovascular Disease

## 2013-11-12 NOTE — Clinical Social Work Placement (Addendum)
    Clinical Social Work Department CLINICAL SOCIAL WORK PLACEMENT NOTE 11/09/2013  Patient:  Gary Underwood,Gary Underwood  Account Number:  [id number] Admit date:  11/05/2013  Clinical Social Worker:  Creta Levin, LCSW  Date/time:  11/08/2013 04:18 AM  Clinical Social Work is seeking post-discharge placement for this patient at the following level of care:   SKILLED NURSING   (*CSW will update this form in Epic as items are completed)   11/08/2013  Patient/family provided with [facility] [place] Department of Clinical Social Work's list of facilities offering this level of care within the geographic area requested by the patient (or if unable, by the patient's family).  11/08/2013  Patient/family informed of their freedom to choose among providers that offer the needed level of care, that participate in Medicare, Medicaid or managed care program needed by the patient, have an available bed and are willing to accept the patient.  11/08/2013  Patient/family informed of [facility]' ownership interest in [facility], as well as of the fact that they are under no obligation to receive care at this facility.  PASARR submitted to [employer] on 11/08/2013 PASARR number received on   FL2 transmitted to all facilities in geographic area requested by pt/family on  11/08/2013 FL2 transmitted to all facilities within larger geographic area on   Patient informed that his/her managed care company has contracts with or will negotiate with  certain facilities, including the following:   NA     Patient/family informed of bed offers received:  11/09/2013 Patient chooses bed at [facility] Physician recommends and patient chooses bed at    Patient to be transferred to [facility] on  11/09/2013 Patient to be transferred to facility by Ambulance  [facility]) Patient and family notified of transfer on 11/09/2013 Name of family member notified:  Pierce Crane  Daughter  The following  physician request were entered in Epic: Physician Request  Please prepare priority discharge summary and prescriptions.  Please sign FL2.    Additional Comments: 11/09/13  OK per MD for d/c today to SNF for rehab.  Patient had been very reluctant to go to SNF but finally agreed to same after his daughter and son strongly stated that he needed to go. Patient is now willing to seek rehab and was noted to be very quiet and agreeable today. He stated that he was "getting ready" as he prepared to leave by packing up his belongings.  Patient denied any concerns or questions.  He stated that his daughter said she was not able to let him come home right now.  Daughter was relieved that her father has agreed to placement and stated that she already was "feeling better" physically now that the worries of his care were reduced. Nursing notified to call report. CSW signing off.

## 2013-11-13 NOTE — Telephone Encounter (Signed)
Closed encounter °

## 2013-11-29 ENCOUNTER — Telehealth: Payer: Self-pay | Admitting: Cardiovascular Disease

## 2013-11-29 NOTE — Telephone Encounter (Signed)
Closed encounter °

## 2013-11-30 ENCOUNTER — Ambulatory Visit: Payer: Medicare Other | Admitting: Cardiology

## 2013-12-19 ENCOUNTER — Telehealth: Payer: Self-pay | Admitting: *Deleted

## 2013-12-19 NOTE — Telephone Encounter (Signed)
Spoke with Richard. He is requesting an order to continue occupational therapy with this patient and also an order for social work consult. Informed Richard from [facility] is does not appear that Dr. Sallyanne Kuster started OT but he can fax over orders to see if Dr. Sallyanne Kuster will continue. Advised the Richard check to see who initiated therapy.   Message forwarded to Dr. Darlen Round

## 2013-12-20 ENCOUNTER — Telehealth: Payer: Self-pay | Admitting: *Deleted

## 2013-12-20 NOTE — Telephone Encounter (Signed)
Signed order faxed for home visits.

## 2013-12-25 ENCOUNTER — Telehealth: Payer: Self-pay | Admitting: Cardiovascular Disease

## 2013-12-25 DIAGNOSIS — R1310 Dysphagia, unspecified: Secondary | ICD-10-CM

## 2013-12-25 NOTE — Telephone Encounter (Signed)
Need another order to do Speech therapy,she was unable to do it last week.

## 2013-12-25 NOTE — Telephone Encounter (Signed)
Please order

## 2013-12-26 ENCOUNTER — Telehealth: Payer: Self-pay | Admitting: *Deleted

## 2013-12-26 NOTE — Telephone Encounter (Signed)
Order placed for speech therapy per Dr. Loletha Grayer.

## 2013-12-26 NOTE — Telephone Encounter (Signed)
Signed Home Health orders faxed

## 2013-12-28 ENCOUNTER — Ambulatory Visit: Payer: Medicare Other | Admitting: Cardiovascular Disease

## 2014-01-17 ENCOUNTER — Telehealth: Payer: Self-pay | Admitting: *Deleted

## 2014-01-17 NOTE — Telephone Encounter (Signed)
Signed order for speech therapy faxed to [place] faxed

## 2014-01-23 ENCOUNTER — Telehealth: Payer: Self-pay | Admitting: *Deleted

## 2014-01-23 NOTE — Telephone Encounter (Signed)
D/C summary signed by PA in the hospital but [facility] needs the physicians signature.  Dr. Loletha Grayer signed and faxed. 01/21/2014

## 2014-01-24 ENCOUNTER — Ambulatory Visit: Payer: Medicare Other | Admitting: Neurology

## 2014-02-24 DEATH — deceased

## 2014-03-13 ENCOUNTER — Encounter: Payer: Self-pay | Admitting: Neurology

## 2014-03-19 ENCOUNTER — Encounter: Payer: Self-pay | Admitting: Neurology

## 2014-10-11 IMAGING — CR DG CHEST 2V
3 series · 3 of 3 positions shown · non-contrast
Comparison: 09/14/2013 radiographs.  CT 08/21/2013.

CLINICAL DATA: Dyspnea.

EXAM:
CHEST  2 VIEW

[x chest ap]
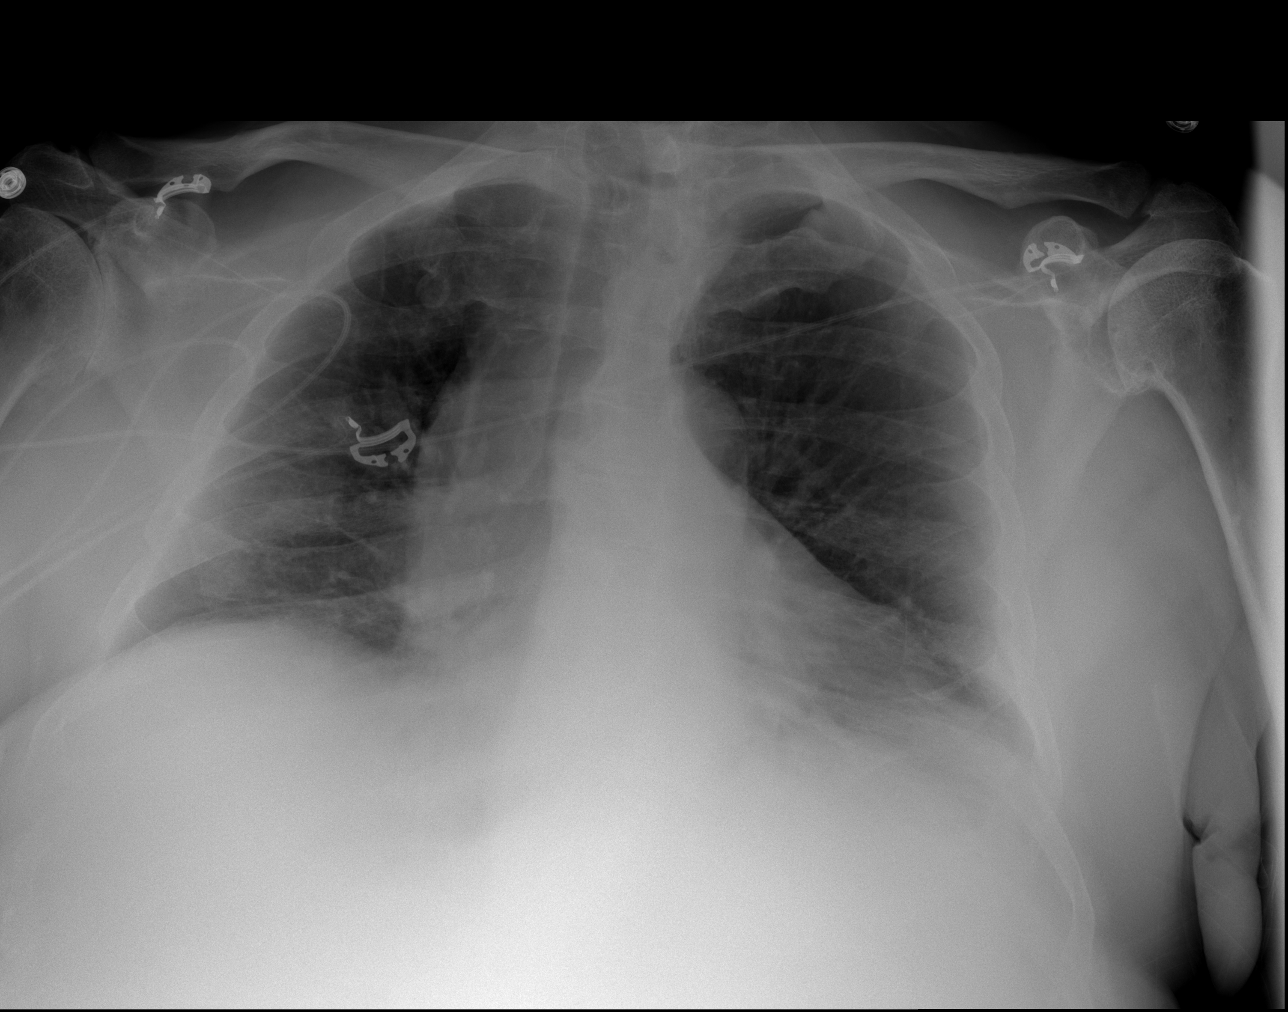

[w chest lat (1 of 2)]
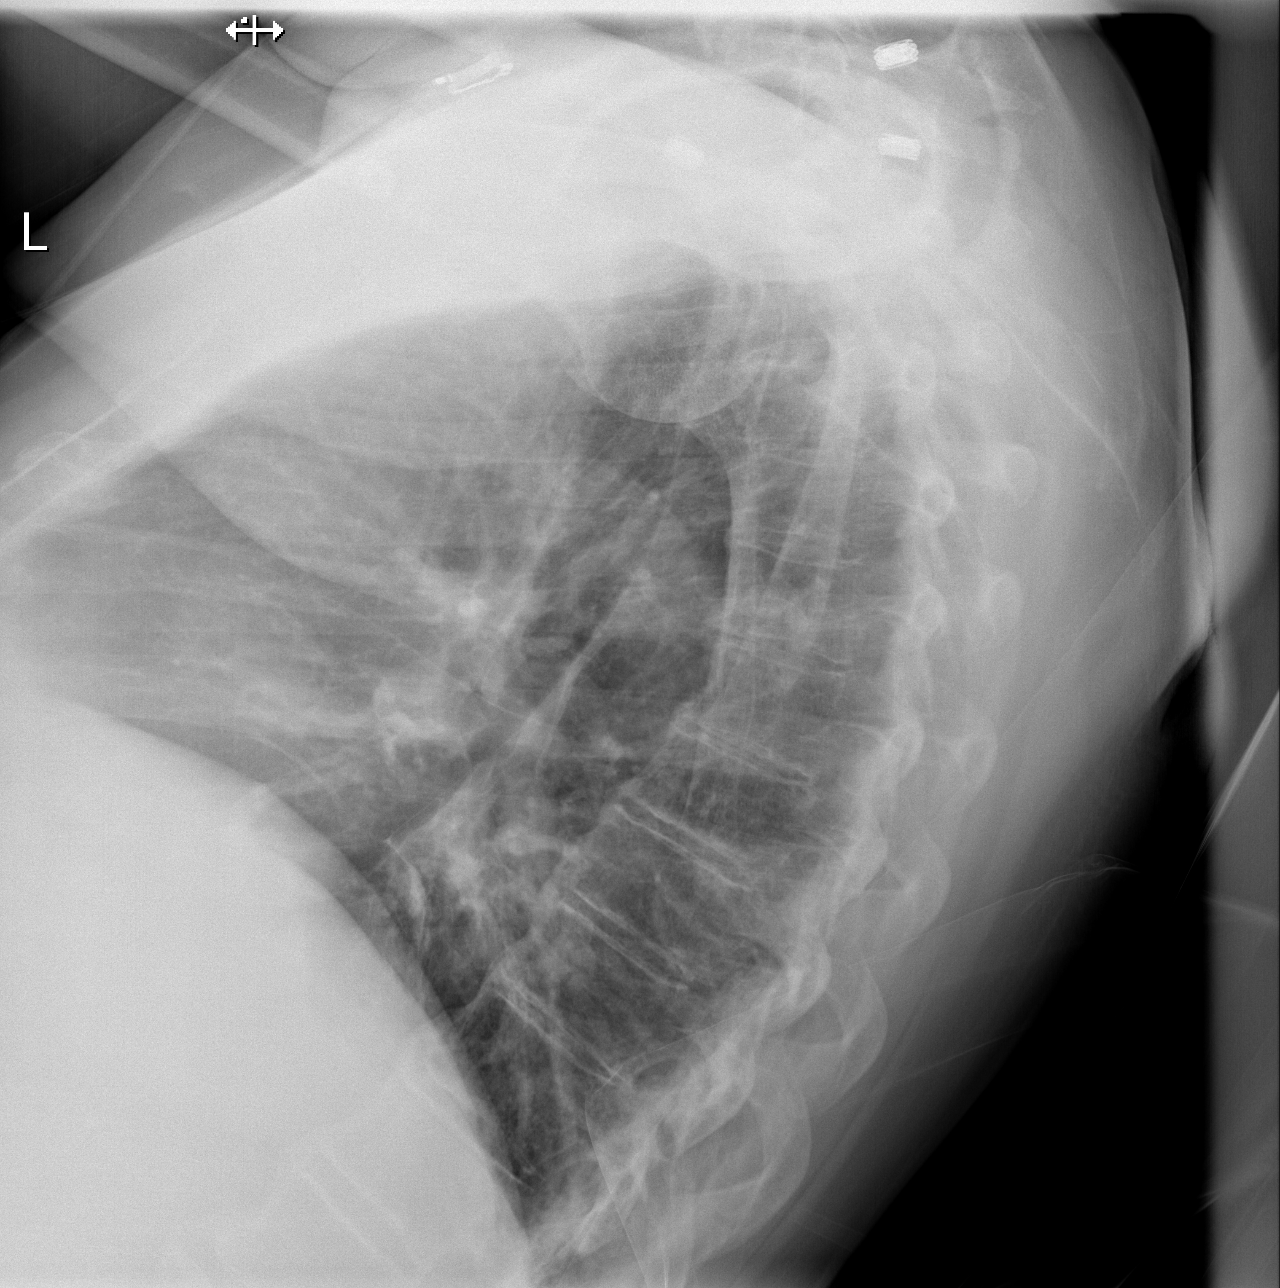

[w chest lat (2 of 2)]
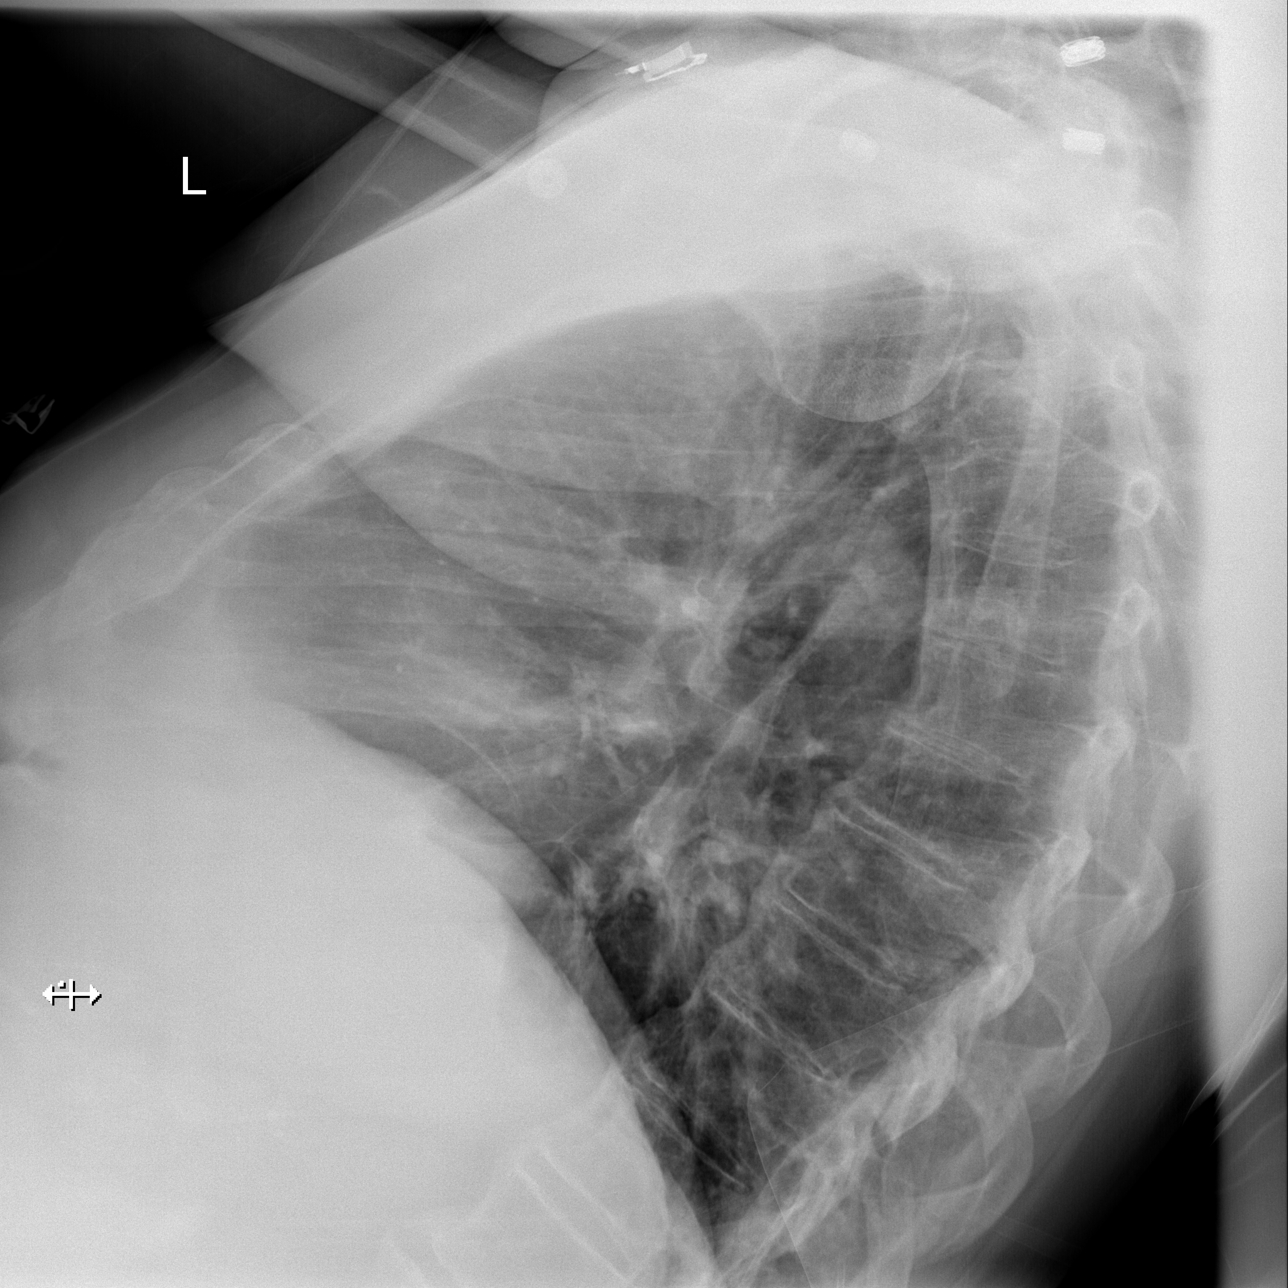

[3 of 3 positions shown; findings below may reference images not displayed]

FINDINGS: There are stable low lung volumes with stable mild prominence of the
bronchovascular markings. No confluent airspace opacity, edema or
significant pleural effusion is seen. The heart size and mediastinal
contours are stable. Advanced glenohumeral degenerative changes are
present on the right.
IMPRESSION: Stable chest.  No acute cardiopulmonary process.
